# Patient Record
Sex: Male | Born: 1950 | Race: White | Hispanic: No | Marital: Married | Smoking: Former smoker
Health system: Southern US, Community
[De-identification: ages and names within clinical notes are randomized; demographics above are authoritative.]

## PROBLEM LIST (undated history)

## (undated) DIAGNOSIS — G6181 Chronic inflammatory demyelinating polyneuritis: Principal | ICD-10-CM

## (undated) DIAGNOSIS — I10 Essential (primary) hypertension: Secondary | ICD-10-CM

## (undated) DIAGNOSIS — G629 Polyneuropathy, unspecified: Secondary | ICD-10-CM

## (undated) DIAGNOSIS — E7800 Pure hypercholesterolemia, unspecified: Secondary | ICD-10-CM

## (undated) DIAGNOSIS — R269 Unspecified abnormalities of gait and mobility: Secondary | ICD-10-CM

## (undated) HISTORY — PX: ABDOMINAL SURGERY: SHX537

## (undated) HISTORY — DX: Chronic inflammatory demyelinating polyneuritis: G61.81

## (undated) HISTORY — DX: Polyneuropathy, unspecified: G62.9

## (undated) HISTORY — DX: Unspecified abnormalities of gait and mobility: R26.9

## (undated) HISTORY — DX: Pure hypercholesterolemia, unspecified: E78.00

## (undated) HISTORY — DX: Essential (primary) hypertension: I10

---

## 1971-10-23 HISTORY — PX: APPENDECTOMY: SHX54

## 1977-10-22 HISTORY — PX: FINGER AMPUTATION: SHX636

## 2001-11-11 ENCOUNTER — Ambulatory Visit (HOSPITAL_COMMUNITY): Admission: RE | Admit: 2001-11-11 | Discharge: 2001-11-11 | Payer: Self-pay | Admitting: Cardiology

## 2002-05-18 ENCOUNTER — Emergency Department (HOSPITAL_COMMUNITY): Admission: EM | Admit: 2002-05-18 | Discharge: 2002-05-18 | Payer: Self-pay | Admitting: Emergency Medicine

## 2002-05-18 ENCOUNTER — Encounter: Payer: Self-pay | Admitting: Emergency Medicine

## 2002-06-25 ENCOUNTER — Other Ambulatory Visit: Admission: RE | Admit: 2002-06-25 | Discharge: 2002-06-25 | Payer: Self-pay | Admitting: General Surgery

## 2005-08-23 ENCOUNTER — Emergency Department (HOSPITAL_COMMUNITY): Admission: EM | Admit: 2005-08-23 | Discharge: 2005-08-23 | Payer: Self-pay | Admitting: Emergency Medicine

## 2005-08-27 ENCOUNTER — Ambulatory Visit (HOSPITAL_COMMUNITY): Admission: RE | Admit: 2005-08-27 | Discharge: 2005-08-27 | Payer: Self-pay | Admitting: General Surgery

## 2005-10-22 HISTORY — PX: CHOLECYSTECTOMY: SHX55

## 2006-01-24 ENCOUNTER — Encounter (HOSPITAL_COMMUNITY): Admission: RE | Admit: 2006-01-24 | Discharge: 2006-02-23 | Payer: Self-pay | Admitting: General Surgery

## 2006-01-28 ENCOUNTER — Ambulatory Visit: Payer: Self-pay | Admitting: Internal Medicine

## 2006-01-29 ENCOUNTER — Ambulatory Visit: Payer: Self-pay | Admitting: Internal Medicine

## 2006-01-29 ENCOUNTER — Ambulatory Visit (HOSPITAL_COMMUNITY): Admission: RE | Admit: 2006-01-29 | Discharge: 2006-01-29 | Payer: Self-pay | Admitting: Internal Medicine

## 2006-02-05 ENCOUNTER — Ambulatory Visit (HOSPITAL_COMMUNITY): Admission: RE | Admit: 2006-02-05 | Discharge: 2006-02-05 | Payer: Self-pay | Admitting: Internal Medicine

## 2006-03-19 ENCOUNTER — Ambulatory Visit: Payer: Self-pay | Admitting: Internal Medicine

## 2006-04-16 ENCOUNTER — Ambulatory Visit: Payer: Self-pay | Admitting: Internal Medicine

## 2007-07-26 IMAGING — NM NM LIVER FUNCTION STUDY
1 series · 6 of 6 positions shown · non-contrast
Comparison: none

CLINICAL DATA: Right upper quadrant pain post cholecystectomy.  Evaluate for bile leak.
 NUCLEAR MEDICINE HEPATOBILIARY SCAN:
TECHNIQUE: Sequential abdominal images were obtained for approximately 60 minutes following intravenous injection of radiopharmaceutical.
 Radiopharmaceutical:  5 mCi Tc-XXm Choletec.

[Series 1: hepatobiliary · 3.20mm/px · 6 of 59 frames shown]
[frame 5/59]
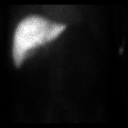
[frame 15/59]
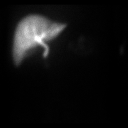
[frame 25/59]
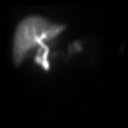
[frame 35/59]
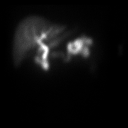
[frame 45/59]
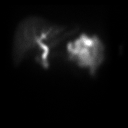
[frame 55/59]
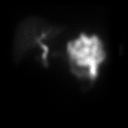

[6 of 6 positions shown; findings below may reference images not displayed]

FINDINGS: There is prompt visualization of the bile ducts and small bowel.  No suggestion of extravasation from the biliary tree.
IMPRESSION: Normal post cholecystectomy--specifically, no evidence for bile leak.

## 2008-02-16 ENCOUNTER — Ambulatory Visit: Payer: Self-pay | Admitting: Cardiology

## 2013-07-24 ENCOUNTER — Encounter: Payer: Self-pay | Admitting: Neurology

## 2013-07-24 ENCOUNTER — Telehealth: Payer: Self-pay | Admitting: *Deleted

## 2013-07-24 ENCOUNTER — Ambulatory Visit (INDEPENDENT_AMBULATORY_CARE_PROVIDER_SITE_OTHER): Payer: 59 | Admitting: Neurology

## 2013-07-24 VITALS — BP 146/100 | HR 84 | Temp 97.9°F | Ht 74.0 in | Wt 270.0 lb

## 2013-07-24 DIAGNOSIS — S0990XA Unspecified injury of head, initial encounter: Secondary | ICD-10-CM

## 2013-07-24 DIAGNOSIS — R2689 Other abnormalities of gait and mobility: Secondary | ICD-10-CM

## 2013-07-24 DIAGNOSIS — R279 Unspecified lack of coordination: Secondary | ICD-10-CM

## 2013-07-24 DIAGNOSIS — R278 Other lack of coordination: Secondary | ICD-10-CM

## 2013-07-24 DIAGNOSIS — G603 Idiopathic progressive neuropathy: Secondary | ICD-10-CM

## 2013-07-24 DIAGNOSIS — R29818 Other symptoms and signs involving the nervous system: Secondary | ICD-10-CM

## 2013-07-24 DIAGNOSIS — G609 Hereditary and idiopathic neuropathy, unspecified: Secondary | ICD-10-CM

## 2013-07-24 DIAGNOSIS — G589 Mononeuropathy, unspecified: Secondary | ICD-10-CM

## 2013-07-24 DIAGNOSIS — G629 Polyneuropathy, unspecified: Secondary | ICD-10-CM

## 2013-07-24 DIAGNOSIS — R6889 Other general symptoms and signs: Secondary | ICD-10-CM

## 2013-07-24 HISTORY — DX: Polyneuropathy, unspecified: G62.9

## 2013-07-24 NOTE — Telephone Encounter (Signed)
[facility] calling about pt having LP done.  He has not had previous CT or MRI brain done.  This is required before having LP done.  Verified with pt he has not had done.

## 2013-07-24 NOTE — Progress Notes (Signed)
Subjective:    Patient ID: Norman [name] is a 62 y.o. male.  HPI  Huston Foley, MD, PhD [facility] [address] [address] [place], [place] [address]  Dear Dr. Leandrew Koyanagi,   I saw your patient, Norman [name], upon your kind request in my neurologic clinic today for initial consultation of his neuropathy. The patient is unaccompanied today. As you know, Norman [name] is a very pleasant 62 year old right-handed gentleman with an underlying medical history of hypertension, hyperlipidemia, history of kidney stones, history of skin cancers, vitamin D deficiency, who has had progressive numbness in his feet and his fingers for the past several months. Symptoms started in the soles of his feet with tingling and progressed to numbness, no pain. He has involvement of his legs up to the knees and his hands. He has tingling in his hands and he has tingling and numbness in the LEs. He has noted fatigue, but no clear weakness and feels off balance. He has not fallen, but bumped his head to the car door while getting in the car. Sx seem to be ascending. He had blood work through your office on 07/15/13, which I reviewed: He had normal ESR, ANA comprehensive profile, B12, Lyme titer, Mg. He has a FHx of heart disease, stroke, breast cancer, no neuropathies, no leukemias. He is not diabetic. He quit smoking over 5 years ago and drinks alcohol on weekends only. 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 He has no Hx of prodromal illness, no exposure to toxins, no F/C/N/D.  Gabapentin was started at 300 mg tid, and helps with the paresthesias, not the numbness. He has no burning pain.   His Past Medical History Is Significant For: Past Medical History  Diagnosis Date  . Hypercholesteremia   . HTN (hypertension)     His Past Surgical History Is Significant For: Past Surgical History  Procedure Laterality Date  . Cholecystectomy  2007  . Finger amputation Right 1979   tips  . Appendectomy  1973  . Abdominal surgery      Skin growth removed    His Family History Is Significant For: Family History  Problem Relation Age of Onset  . Cancer Father   . Stroke Mother     His Social History Is Significant For: History   Social History  . Marital Status: Married    Spouse Name: N/A    Number of Children: N/A  . Years of Education: N/A   Social History Main Topics  . Smoking status: Former Smoker    Types: Cigarettes    Quit date: 07/24/2008  . Smokeless tobacco: None  . Alcohol Use: 2.0 oz/week    4 drink(s) per week  . Drug Use: None  . Sexual Activity: None   Other Topics Concern  . None   Social History Narrative  . None    His Allergies Are:  Allergies  Allergen Reactions  . Penicillins   . Prozac [Fluoxetine Hcl]   :   His Current Medications Are:  Outpatient Encounter Prescriptions as of 07/24/2013  Medication Sig Dispense Refill  . amLODipine (NORVASC) 10 MG tablet Take 1 tablet by mouth daily.      Marland Kitchen atorvastatin (LIPITOR) 20 MG tablet Take 1 tablet by mouth daily.      Marland Kitchen gabapentin (NEURONTIN) 300 MG capsule Take 1 capsule by mouth 3 (three) times daily.      Marland Kitchen losartan (COZAAR) 100 MG tablet Take 1 tablet by mouth daily.  No facility-administered encounter medications on file as of 07/24/2013.   Review of Systems:  Out of a complete 14 point review of systems, all are reviewed and negative with the exception of these symptoms as listed below:  Review of Systems  Constitutional: Positive for fatigue.  Respiratory: Positive for shortness of breath.   Musculoskeletal:       Cramps  Neurological: Positive for dizziness and numbness.       Restless leg    Objective:  Neurologic Exam  Physical Exam Physical Examination:   Filed Vitals:   07/24/13 0828  BP: 146/100  Pulse: 84  Temp: 97.9 F (36.6 C)    General Examination: The patient is a very pleasant 62 y.o. male in no acute distress. He appears  well-developed and well-nourished and adequately groomed. He is obese.  HEENT: Normocephalic, atraumatic, pupils are equal, round and reactive to light and accommodation. Funduscopic exam is normal with sharp disc margins noted. Extraocular tracking is good without limitation to gaze excursion or nystagmus noted. Normal smooth pursuit is noted. Hearing is grossly intact. Tympanic membranes are clear bilaterally. Face is symmetric with normal facial animation and normal facial sensation. Speech is clear with no dysarthria noted. There is no hypophonia. There is no lip, neck/head, jaw or voice tremor. Neck is supple with full range of passive and active motion. There are no carotid bruits on auscultation. Oropharynx exam reveals: mild mouth dryness, adequate dental hygiene and moderate airway crowding. Mallampati is class II. Tongue protrudes centrally and palate elevates symmetrically.   Chest: Clear to auscultation without wheezing, rhonchi or crackles noted.  Heart: S1+S2+0, regular and normal without murmurs, rubs or gallops noted.   Abdomen: Soft, non-tender and non-distended with normal bowel sounds appreciated on auscultation.  Extremities: There is trace pitting edema in the distal lower extremities bilaterally. Pedal pulses are intact.  Skin: Warm and dry without trophic changes noted. There are no varicose veins.  Musculoskeletal: exam reveals no obvious joint deformities, tenderness or joint swelling or erythema.   Neurologically:  Mental status: The patient is awake, alert and oriented in all 4 spheres. His memory, attention, language and knowledge are appropriate. There is no aphasia, agnosia, apraxia or anomia. Speech is clear with normal prosody and enunciation. Thought process is linear. Mood is congruent and affect is normal.  Cranial nerves are as described above under HEENT exam. In addition, shoulder shrug is normal with equal shoulder height noted. Motor exam: Normal bulk,  strength and tone is noted. There is no drift, tremor or rebound. Romberg is positive. Reflexes are 1+ in the upper extremities and absent in the lower extremities. Toes are equivocal bilaterally. Fine motor skills are intact with normal finger taps, normal hand movements, normal rapid alternating patting, normal foot taps and normal foot agility.  Cerebellar testing shows no dysmetria or intention tremor on finger to nose testing. Heel to shin is unremarkable bilaterally. There is no truncal ataxia.  Sensory exam is intact to light touch, pinprick, vibration, temperature sense and proprioception in the upper extremities, with perhaps mild decrease in pinprick sensation in the fingers. In the lower extremities he has significant decrease in light touch, pinprick, vibration, and temperature sense up to mid calf bilaterally. In addition he has absence of proprioception in his toes bilaterally.  Gait, station and balance: He stands up with mild difficulty and stands wide-based. He has significant ataxia while walking. He turns with mild insecurity. Posture is age-appropriate. He has preserved arm swing bilaterally. Assessment and  Plan:   In summary, Norman [name] is a very pleasant 62 y.o.-year old male with a history of progressive neuropathy in the past year. He has significant decrease in sensation to all modalities in both lower extremities, with absent reflexes in both lower extremities. The etiology of his neuropathy is thus far unclear. He is not diabetic, he has no family history, and he has no prior exposure to toxins. He does not drink alcohol excessively. I would like to pursue further workup with additional blood work, EMG and nerve conduction testing, heavy metal testing, and spinal fluid testing for a concern for CIDP. I may ask one of my neuromuscular colleagues to help out with further diagnostic testing and management in the near future. I had a long chat with the patient about his symptoms, my  findings, and his neuropathy. I do believe that this is primarily a peripheral process. I do not think he has any history or physical findings concerning for radiculopathy or myopathy or a central nervous system problem. At this juncture, I will do further testing and we will take it from there. I will see him in followup fairly soon and we should be able to call him with his test results as we get results back. He was in agreement. As far as medication is concerned he is on gabapentin 300 mg 3 times a day with improvement of his paresthesias and since he does not have much in the way of pain there is no reason to change or increase his medication at this time. I've encouraged him to call with any additional questions, concerns, problems or updates that may arise in the interim.  Thank you very much for allowing me to participate in the care of this nice patient. If I can be of any further assistance to you please do not hesitate to call me at [phone].  Sincerely,   Huston Foley, MD, PhD

## 2013-07-24 NOTE — Patient Instructions (Signed)
I think overall you are doing fairly well but I do want to suggest a few things today:  Remember to drink plenty of fluid, eat healthy meals and do not skip any meals. Try to eat protein with a every meal and eat a healthy snack such as fruit or nuts in between meals. Try to keep a regular sleep-wake schedule and try to exercise daily, particularly in the form of walking, 20-30 minutes a day, if you can.   As far as your medications are concerned, I would like to suggest no new medications.    As far as diagnostic testing: EMG/NCV, blood work, spinal fluid, urine for heavy metals.  I would like to see you back in 3 months, sooner if we need to. Please call us with any interim questions, concerns, problems, updates or refill requests.  Please also call us for any test results so we can go over those with you on the phone. Brett Canales is my clinical assistant and will answer any of your questions and relay your messages to me and also relay most of my messages to you.  Our phone number is [phone]. We also have an after hours call service for urgent matters and there is a physician on-call for urgent questions. For any emergencies you know to call 911 or go to the nearest emergency room.

## 2013-07-24 NOTE — Telephone Encounter (Signed)
I LMVM for pt re: CT head ordered prior to LP.  Needs authorization and then will proceed with LP once results obtained.  Thanks

## 2013-07-24 NOTE — Telephone Encounter (Signed)
Entered CT head request. Pls advise pt, that we will go ahead and do CT head as it is required before LP, plus: he had reported that bump to his head, when he hit his head as he was getting into his car the other day.

## 2013-07-28 ENCOUNTER — Encounter (INDEPENDENT_AMBULATORY_CARE_PROVIDER_SITE_OTHER): Payer: Self-pay | Admitting: Radiology

## 2013-07-28 ENCOUNTER — Ambulatory Visit (INDEPENDENT_AMBULATORY_CARE_PROVIDER_SITE_OTHER): Payer: 59 | Admitting: Neurology

## 2013-07-28 DIAGNOSIS — G5602 Carpal tunnel syndrome, left upper limb: Secondary | ICD-10-CM

## 2013-07-28 DIAGNOSIS — G609 Hereditary and idiopathic neuropathy, unspecified: Secondary | ICD-10-CM

## 2013-07-28 DIAGNOSIS — Z0289 Encounter for other administrative examinations: Secondary | ICD-10-CM

## 2013-07-28 DIAGNOSIS — R2689 Other abnormalities of gait and mobility: Secondary | ICD-10-CM

## 2013-07-28 DIAGNOSIS — G5601 Carpal tunnel syndrome, right upper limb: Secondary | ICD-10-CM

## 2013-07-28 DIAGNOSIS — G6181 Chronic inflammatory demyelinating polyneuritis: Secondary | ICD-10-CM

## 2013-07-28 DIAGNOSIS — G603 Idiopathic progressive neuropathy: Secondary | ICD-10-CM

## 2013-07-28 LAB — IFE AND PE, SERUM
Albumin/Glob SerPl: 1.3 (ref 0.7–2.0)
IgA/Immunoglobulin A, Serum: 391 mg/dL (ref 91–414)
IgM (Immunoglobulin M), Srm: 52 mg/dL (ref 40–230)
Total Protein: 6.3 g/dL (ref 6.0–8.5)

## 2013-07-28 LAB — C-REACTIVE PROTEIN: CRP: 10.3 mg/L — ABNORMAL HIGH (ref 0.0–4.9)

## 2013-07-28 LAB — AMMONIA: Ammonia: 104 ug/dL — ABNORMAL HIGH (ref 27–102)

## 2013-07-28 LAB — HEAVY METALS PROFILE II, BLOOD
Arsenic: 5 ug/L (ref 2–23)
Cadmium: 0.7 ug/L (ref 0.0–1.2)
Mercury: 2.6 ug/L (ref 0.0–14.9)

## 2013-07-28 LAB — RHEUMATOID FACTOR: Rhuematoid fact SerPl-aCnc: <7 IU/mL (ref 0.0–13.9)

## 2013-07-28 LAB — HTLV-I/II ANTIBODIES, QUAL.: HTLV I/II Ab: NEGATIVE

## 2013-07-28 NOTE — Procedures (Signed)
  HISTORY:  Norman [name] is a 62 year old gentleman with a six-month history of progressive numbness involving the lower extremities and now the hands. The patient has had some gait instability. The patient is being evaluated for a possible peripheral neuropathy. There is no history of diabetes.  NERVE CONDUCTION STUDIES:   Nerve conduction studies were performed on both upper extremities. The distal motor latencies for the median nerves were prolonged bilaterally, normal for the ulnar nerves bilaterally. The motor amplitudes for the median and ulnar nerves were normal bilaterally. Slowing was seen for the median nerves bilaterally, normal for the ulnar nerves bilaterally. The F wave latencies for the median and ulnar nerves were prolonged bilaterally. The sensory latencies for the median nerves were borderline normal on the right, absent on the left, and prolonged for the ulnar nerves bilaterally, normal for the radial nerves bilaterally.  Nerve conduction studies were performed on both lower extremities. No response was seen for the right peroneal nerve, with prolongation of the distal motor latency for the left peroneal nerve, with a low motor amplitude. The distal motor latencies for the posterior tibial nerves were normal bilaterally, with a low motor amplitude on the right, normal on the left. Slowing was seen for the left peroneal nerve and for the posterior tibial nerves bilaterally. Significant prolongation of the F wave latencies for the left peroneal nerve and for the posterior tibial nerves bilaterally were seen. No response was seen for the right peroneal nerve F wave latency. The peroneal sensory latencies were unobtainable bilaterally.  EMG STUDIES:  EMG study was performed on the left lower extremity:  The tibialis anterior muscle reveals 2 to 4K motor units with full recruitment. No fibrillations or positive waves were seen. The peroneus tertius muscle reveals 2 to 5K motor units  with slightly decreased recruitment. No fibrillations or positive waves were seen. The medial gastrocnemius muscle reveals 1 to 3K motor units with full recruitment. No fibrillations or positive waves were seen. The vastus lateralis muscle reveals 2 to 4K motor units with full recruitment. No fibrillations or positive waves were seen. The iliopsoas muscle reveals 2 to 4K motor units with full recruitment. No fibrillations or positive waves were seen. The biceps femoris muscle (long head) reveals 2 to 4K motor units with full recruitment. No fibrillations or positive waves were seen. The lumbosacral paraspinal muscles were tested at 3 levels, and revealed no abnormalities of insertional activity at all 3 levels tested. There was good relaxation.   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 Bilateral carpal tunnel syndrome is seen of mild severity. EMG evaluation of the left lower extremity is almost normal, once again supporting the diagnosis of a primarily demyelinating peripheral neuropathy. There is no evidence of an overlying lumbosacral radiculopathy on the left. 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  Marlan Palau MD 07/28/2013 10:57 AM  [facility] [address] [place], [place] [address]  Phone [phone] Fax [phone]

## 2013-07-29 ENCOUNTER — Telehealth: Payer: Self-pay | Admitting: Neurology

## 2013-07-29 DIAGNOSIS — G6181 Chronic inflammatory demyelinating polyneuritis: Secondary | ICD-10-CM

## 2013-07-29 NOTE — Telephone Encounter (Signed)
I called and let patient know about his test results. I explained to him that his diabetes marker was borderline elevated at 5.8 and this underlies the importance of weight control and eating right. His vitamin D level was borderline low but he has recently started taking over-the-counter vitamin D. His CRP was elevated at twice the normal which indicates that there may be an inflammatory process going on. It could be in keeping with his demyelinating neuropathy. His ammonia level was slightly elevated borderline at 104 and I asked him to just be mindful of taking medication. This may just indicate that he is on chronic medications. It may be something that needs to be rechecked down the Road, this is certainly not in the toxic range. He demonstrated understanding. 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  I suggested that we go ahead and request for Dr. Anne Hahn to help him with further diagnostics and management. I had asked Dr. Anne Hahn verbally yesterday they would be willing to take care of this patient and he kindly agreed.  I had ordered a head CT and a fluoroscopic guided lumbar puncture but none of these have been scheduled yet. I will follow through on this because it is important that we get these test results. I told him I will follow through today on his head CT and lumbar puncture appointments. He demonstrated understanding and agreement with the plan. I will put in a referral for Dr. Anne Hahn.  Alverda Skeans: can you follow up on CT head and LP appts?   Sandy Poteat: can you help arrange new pt appt w Dr. Anne Hahn on this patient?

## 2013-07-29 NOTE — Progress Notes (Signed)
Quick Note:  I called and let patient know about his test results. I explained to him that his diabetes marker was borderline elevated at 5.8 and this underlies the importance of weight control and eating right. His vitamin D level was borderline low but he has recently started taking over-the-counter vitamin D. His CRP was elevated at twice the normal which indicates that there may be an inflammatory process going on. It could be in keeping with his demyelinating neuropathy. His ammonia level was slightly elevated borderline at 104 and I asked him to just be mindful of taking medication. This may just indicate that he is on chronic medications. It may be something that needs to be rechecked down the Road, this is certainly not in the toxic range. He demonstrated understanding. 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 I suggested that we go ahead and request for Dr. Anne Hahn to help him with further diagnostics and management. I had ordered a head CT and a fluoroscopic guided lumbar puncture but none of these have been scheduled yet. I will follow through on this because it is important that we get these test results. I told him I will follow through today on his head CT and lumbar puncture appointments. He demonstrated understanding and agreement with the plan. I will put in a referral for Dr. Anne Hahn. ______

## 2013-08-03 ENCOUNTER — Telehealth: Payer: Self-pay | Admitting: Neurology

## 2013-08-03 ENCOUNTER — Other Ambulatory Visit: Payer: Self-pay | Admitting: Neurology

## 2013-08-03 MED ORDER — PREDNISONE 20 MG PO TABS: 40.0000 mg | ORAL_TABLET | Freq: Every day | ORAL | Status: DC

## 2013-08-03 NOTE — Telephone Encounter (Signed)
I called patient. The patient has been set up for a CT scan of the brain, and a spinal tap. Once this has been done, he is to start prednisone. I will need a revisit sometime within the next 3 months.

## 2013-08-04 NOTE — Telephone Encounter (Signed)
I gave the request for LP and CT to [facility] in referrals.  Diane as NP coordinator, the referral for NP app with Dr. Anne Hahn.

## 2013-08-12 LAB — HEAVY METALS PROFILE II, URINE
Cadmium, Ur: NOT DETECTED ug/L
Mercury, Ur: NOT DETECTED ug/L (ref 0–19)

## 2013-08-13 NOTE — Progress Notes (Signed)
Quick Note:  Please advise patient that there were no significant levels of any heavy metals detected in his urine. ______

## 2013-08-13 NOTE — Progress Notes (Signed)
Quick Note:  Shared Dr Teofilo Pod findings as noted, confirmed w/ patient ______

## 2013-08-17 DIAGNOSIS — R209 Unspecified disturbances of skin sensation: Secondary | ICD-10-CM

## 2013-08-18 ENCOUNTER — Ambulatory Visit
Admission: RE | Admit: 2013-08-18 | Discharge: 2013-08-18 | Disposition: A | Discharge status: Home / Self Care | Payer: 59 | Source: Ambulatory Visit | Attending: Neurology | Admitting: Neurology

## 2013-08-18 VITALS — BP 116/80 | HR 80

## 2013-08-18 DIAGNOSIS — G629 Polyneuropathy, unspecified: Secondary | ICD-10-CM

## 2013-08-18 DIAGNOSIS — G603 Idiopathic progressive neuropathy: Secondary | ICD-10-CM

## 2013-08-18 DIAGNOSIS — R2689 Other abnormalities of gait and mobility: Secondary | ICD-10-CM

## 2013-08-18 DIAGNOSIS — G609 Hereditary and idiopathic neuropathy, unspecified: Secondary | ICD-10-CM

## 2013-08-18 DIAGNOSIS — S0990XA Unspecified injury of head, initial encounter: Secondary | ICD-10-CM

## 2013-08-18 LAB — CSF CELL COUNT WITH DIFFERENTIAL: Tube #: 2

## 2013-08-18 LAB — GLUCOSE, CSF: Glucose, CSF: 66 mg/dL (ref 43–76)

## 2013-08-18 NOTE — Progress Notes (Signed)
Pt returned from LP. Pt is in good spirits and son is at bedside.

## 2013-08-18 NOTE — Progress Notes (Signed)
Blood drawn to go with spinal fluid for study. Blood drawn from right [facility], site is unremarkable and pt tolerated well. Discharge instructions explained to pt.

## 2013-08-19 ENCOUNTER — Telehealth: Payer: Self-pay | Admitting: Neurology

## 2013-08-19 NOTE — Telephone Encounter (Signed)
I called patient. 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 The patient believes that he is continued to progress even since the nerve conduction study with numbness into the thigh level. The patient will go up on the prednisone taking 60 mg daily. I'll see him back in 6 weeks. If progression continues, we will get him started on IVIG. The patient is on vitamin D, taking 2000 international units daily.

## 2013-08-20 LAB — ANGIOTENSIN CONVERTING ENZYME, CSF: ACE, CSF: 9 U/L (ref <=15)

## 2013-08-20 NOTE — Progress Notes (Signed)
Quick Note:  Dr. Anne Hahn spoke to pt 08-19-13 at 1808. ______

## 2013-08-20 NOTE — Progress Notes (Signed)
Quick Note:  Please advise patient that his head CT was fine. I was trying to get in touch with him yesterday regarding his spinal fluid test results. Please ask them to provide you with a alternative number as there is only one number listed at all it said was a number not his name so it is not possible for [place] to leave any detailed messages. 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 I have discussed this with Dr. Anne Hahn who will be in touch with the patient regarding possibly initiating treatment. Huston Foley, MD, PhD [facility]  ______

## 2013-08-22 LAB — CSF CULTURE W GRAM STAIN: Organism ID, Bacteria: NO GROWTH

## 2013-08-24 LAB — VIRAL CULTURE VIRC

## 2013-08-24 LAB — MYELIN BASIC PROTEIN, CSF: Myelin Basic Protein: <2 mcg/L (ref 0.0–4.0)

## 2013-08-25 ENCOUNTER — Encounter: Payer: Self-pay | Admitting: Neurology

## 2013-08-26 ENCOUNTER — Telehealth: Payer: Self-pay | Admitting: Neurology

## 2013-08-26 ENCOUNTER — Encounter: Payer: Self-pay | Admitting: Neurology

## 2013-08-26 NOTE — Telephone Encounter (Signed)
Patient calling to speak with Dr Frances Furbish about LP,returning her call

## 2013-08-26 NOTE — Telephone Encounter (Signed)
I was able to talk at length with the patient over the phone. I reiterated the lumbar puncture test results which are in keeping with the diagnosis of CIDP. Per Dr. Anne Hahn he has started on prednisone 60 mg daily and is tolerating it well. He feels that symptoms have stabilized and thankfully are not progressing at this point. He has minimal to no side effects with the prednisone with the exception of increase in appetite and mild insomnia. I advised him that he can take an over-the-counter sleep aid such as Unisom or storebrand sleep aid to help his symptoms. I did ask him to be mindful of weight gain, some of which can be in the form of fluid retention. He understood and was in agreement. He is going to continue with the prednisone as prescribed and will see Dr. Anne Hahn on 09/28/2013. He will also probably talked to Dr. Anne Hahn about the use of IVIG. The patient did not have any additional questions. I talked to him about the rest of his CSF results as well as his head CT result which was unremarkable.

## 2013-08-27 ENCOUNTER — Other Ambulatory Visit: Payer: Self-pay

## 2013-09-15 LAB — FUNGUS CULTURE W SMEAR: Smear Result: NONE SEEN

## 2013-09-19 ENCOUNTER — Other Ambulatory Visit: Payer: Self-pay

## 2013-09-19 MED ORDER — PREDNISONE 20 MG PO TABS: 60.0000 mg | ORAL_TABLET | Freq: Every day | ORAL | Status: DC

## 2013-09-28 ENCOUNTER — Ambulatory Visit (INDEPENDENT_AMBULATORY_CARE_PROVIDER_SITE_OTHER): Payer: 59 | Admitting: Neurology

## 2013-09-28 ENCOUNTER — Encounter: Payer: Self-pay | Admitting: Neurology

## 2013-09-28 VITALS — BP 144/92 | HR 66 | Wt 269.0 lb

## 2013-09-28 DIAGNOSIS — R269 Unspecified abnormalities of gait and mobility: Secondary | ICD-10-CM | POA: Insufficient documentation

## 2013-09-28 DIAGNOSIS — G6181 Chronic inflammatory demyelinating polyneuritis: Secondary | ICD-10-CM

## 2013-09-28 HISTORY — DX: Unspecified abnormalities of gait and mobility: R26.9

## 2013-09-28 HISTORY — DX: Chronic inflammatory demyelinating polyneuritis: G61.81

## 2013-09-28 NOTE — Progress Notes (Signed)
Reason for visit: CIDP  Norman [name] is an 62 y.o. male  History of present illness:  Norman [name] is a 62 year old right-handed white male with a history of progressive weakness of the extremities, numbness of the arms and legs. The patient was found to have features typical for CIDP on nerve conduction study. The patient was placed on prednisone taking 60 mg daily with some benefit. The patient has had a lot of swelling the legs on this medication, but some of the numbness affecting the thigh area has improved. The patient still has significant balance issues, and he is falling on a regular basis, 2 or 3 times a week. The patient is using a cane for ambulation, but he may have to get a walker. The patient is having problems with balance in the shower, and he will be getting a shower chair. The patient denies any significant discomfort in the legs or feet. The patient returns to this office for further evaluation.  Past Medical History  Diagnosis Date  . Hypercholesteremia   . HTN (hypertension)   . Neuropathy 07/24/2013  . 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 Abnormality of gait 09/28/2013    Past Surgical History  Procedure Laterality Date  . Cholecystectomy  2007  . 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 Appendectomy  1973  . Abdominal surgery      Skin growth removed    Family History  Problem Relation Age of Onset  . Cancer Father   . Stroke Mother     Social history:  reports that he quit smoking about 5 years ago. His smoking use included Cigarettes. He smoked 0.00 packs per day. He has never used smokeless tobacco. He reports that he drinks about 2.0 ounces of alcohol per week. He reports that he does not use illicit drugs.    Allergies  Allergen Reactions  . Penicillins     infant  . Prozac [Fluoxetine Hcl] Other (See Comments)    Made pt jittery    Medications:  Current Outpatient Prescriptions on File Prior to Visit    Medication Sig Dispense Refill  . amLODipine (NORVASC) 10 MG tablet Take 1 tablet by mouth daily.      Marland Kitchen atorvastatin (LIPITOR) 20 MG tablet Take 1 tablet by mouth daily.      . cholecalciferol (VITAMIN D) 1000 UNITS tablet Take 1,000 Units by mouth 2 (two) times daily.      Marland Kitchen gabapentin (NEURONTIN) 300 MG capsule Take 1 capsule by mouth 3 (three) times daily.      Marland Kitchen losartan (COZAAR) 100 MG tablet Take 1 tablet by mouth daily.      . predniSONE (DELTASONE) 20 MG tablet Take 3 tablets (60 mg total) by mouth daily.  270 tablet  0   No current facility-administered medications on file prior to visit.    ROS:  Out of a complete 14 system review of symptoms, the patient complains only of the following symptoms, and all other reviewed systems are negative.  Fatigue Leg swelling Restless legs, frequent walking Frequency of urination Muscle cramps, coordination problems, gait problems Numbness, weakness  Blood pressure 144/92, pulse 66, weight 269 lb (122.018 kg).  Physical Exam  General: The patient is alert and cooperative at the time of the examination. The patient is moderately obese.  Skin: 3+ edema below the knees is seen bilaterally.   Neurologic Exam  Mental status: The patient is oriented x 3.  Cranial nerves:  Facial symmetry is present. Speech is normal, no aphasia or dysarthria is noted. Extraocular movements are full. Visual fields are full.  Motor: The patient has good strength in all 4 extremities, with exception of some weakness of the intrinsic muscles of the hands bilaterally, and mild bilateral foot drops.  Sensory examination: The patient has significantly impaired pinprick sensation up to the knees bilaterally.  Coordination: The patient has good finger-nose-finger and heel-to-shin bilaterally.  Gait and station: The patient has a wide-based, unsteady gait. The patient walks with a cane. Tandem gait was not attempted. Romberg is negative, but is unsteady. No  drift is seen.  Reflexes: Deep tendon reflexes are symmetric, but are absent throughout.   Assessment/Plan:  1. CIDP  2. Gait disturbance  The patient may have stabilized somewhat prednisone, but he is having significant walking problems, falls. The patient will be placed on IVIG to help elucidate benefit. The patient will go from 60 mg daily to 40 mg daily of prednisone when he starts the IVIG. The patient will be followed up in about 2 months.  Marlan Palau MD 09/28/2013 12:04 PM  [facility] [address] [place], [place] [address]  Phone [phone] Fax [phone]

## 2013-09-28 NOTE — Patient Instructions (Signed)

## 2013-10-26 ENCOUNTER — Telehealth: Payer: Self-pay | Admitting: Neurology

## 2013-10-26 ENCOUNTER — Ambulatory Visit: Payer: Self-pay | Admitting: Neurology

## 2013-10-26 NOTE — Telephone Encounter (Signed)
[facility] and she will start the process for the Home IVIG.  I also spoke with the patient and told him [facility] will be in touch with him in the next 2 days.

## 2013-10-29 ENCOUNTER — Telehealth: Payer: Self-pay | Admitting: Neurology

## 2013-10-29 NOTE — Telephone Encounter (Signed)
NEEDS DIRECTIONS ABOUT DOSAGE FOR IVIG EVERY 3 WEEKS

## 2013-10-30 NOTE — Telephone Encounter (Signed)
I called and [facility] to [name] at [facility] and relayed the directions for the IVIG, (calrifying the order).  She verbalized understanding.

## 2013-11-05 ENCOUNTER — Other Ambulatory Visit: Payer: Self-pay | Admitting: Neurology

## 2013-11-25 ENCOUNTER — Ambulatory Visit: Payer: 59 | Admitting: Neurology

## 2013-12-10 ENCOUNTER — Encounter: Payer: Self-pay | Admitting: Neurology

## 2013-12-10 ENCOUNTER — Ambulatory Visit (INDEPENDENT_AMBULATORY_CARE_PROVIDER_SITE_OTHER): Payer: 59 | Admitting: Neurology

## 2013-12-10 VITALS — BP 136/94 | HR 91 | Ht 74.75 in | Wt 274.0 lb

## 2013-12-10 DIAGNOSIS — R269 Unspecified abnormalities of gait and mobility: Secondary | ICD-10-CM

## 2013-12-10 DIAGNOSIS — G6181 Chronic inflammatory demyelinating polyneuritis: Secondary | ICD-10-CM

## 2013-12-10 MED ORDER — PREDNISONE 20 MG PO TABS: 30.0000 mg | ORAL_TABLET | Freq: Every day | ORAL | Status: DC

## 2013-12-10 NOTE — Progress Notes (Signed)
Reason for visit: CIDP  Norman [name] is an 63 y.o. male  History of present illness:  Norman [name] is a 63 year old right-handed white male with a history of a peripheral neuropathy consistent with CIDP. The patient has undergone lumbar puncture that revealed evidence of elevated protein levels, no inflammatory cells. The patient has initiated a five-day course of IVIG 3 weeks ago, and he will be getting his next dose in one week. The patient will be getting 2 doses of IVIG every 4 weeks. The patient is on prednisone, and he has dropped from 60 mg daily to 40 mg daily. The patient felt poorly for 1-1/2 days with the drop in the prednisone dose. The patient has had an occasional fall. The patient uses a cane for ambulation. The patient reports some numbness up to the distal thigh level in the legs. The patient has prominent peripheral edema, and this has improved slightly with the lower prednisone dose. The patient returns for an evaluation. The patient has not noted any change in his strength.  Past Medical History  Diagnosis Date  . Hypercholesteremia   . HTN (hypertension)   . Neuropathy 07/24/2013  . 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 Abnormality of gait 09/28/2013    Past Surgical History  Procedure Laterality Date  . Cholecystectomy  2007  . 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 Appendectomy  1973  . Abdominal surgery      Skin growth removed    Family History  Problem Relation Age of Onset  . Cancer Father   . Stroke Mother     Social history:  reports that he quit smoking about 5 years ago. His smoking use included Cigarettes. He smoked 0.00 packs per day. He has never used smokeless tobacco. He reports that he drinks about 2.0 ounces of alcohol per week. He reports that he does not use illicit drugs.    Allergies  Allergen Reactions  . Penicillins     infant  . Prozac [Fluoxetine Hcl] Other (See Comments)    Made pt jittery      Medications:  Current Outpatient Prescriptions on File Prior to Visit  Medication Sig Dispense Refill  . amLODipine (NORVASC) 10 MG tablet Take 1 tablet by mouth daily.      Marland Kitchen atorvastatin (LIPITOR) 20 MG tablet Take 1 tablet by mouth daily.      . cholecalciferol (VITAMIN D) 1000 UNITS tablet Take 1,000 Units by mouth 2 (two) times daily.      Marland Kitchen gabapentin (NEURONTIN) 300 MG capsule Take 1 capsule by mouth 3 (three) times daily.      Marland Kitchen losartan (COZAAR) 100 MG tablet Take 1 tablet by mouth daily.       No current facility-administered medications on file prior to visit.    ROS:  Out of a complete 14 system review of symptoms, the patient complains only of the following symptoms, and all other reviewed systems are negative.  Fatigue Restless legs, insomnia, daytime drowsiness Walking difficulties Dizziness, numbness, weakness  Blood pressure 136/94, pulse 91, height 6' 2.75" (1.899 m), weight 274 lb (124.286 kg).  Physical Exam  General: The patient is alert and cooperative at the time of the examination. The patient is markedly obese.  Skin: 3+ edema below the knees is noted bilaterally.   Neurologic Exam  Mental status: The patient is oriented x 3.  Cranial nerves: Facial symmetry is present. Speech is normal, no aphasia or dysarthria is  noted. Extraocular movements are full. Visual fields are full.  Motor: The patient has good strength in all 4 extremities. There is no evidence of a footdrop on either side.  Sensory examination: Soft touch sensation is depressed in both feet and legs, slightly more common on the left than the right. Soft touch sensation is symmetric on the hands and face.  Coordination: The patient has good finger-nose-finger and heel-to-shin bilaterally.  Gait and station: The patient has a slightly wide-based gait, the patient uses a cane for ambulation. Tandem gait was not attempted. Romberg is positive. No drift is seen.  Reflexes: Deep tendon  reflexes are symmetric, but reflexes are absent throughout.   Assessment/Plan:  1. CIDP  The patient will continue his IVIG treatments, getting two infusions once a month. The patient will reduce the prednisone dose to 30 mg daily next week. The patient will followup through this office in about 3 months. Sometime within the next 6 months, a nerve conduction study will be repeated.  Marlan Palau. Keith Willis MD 12/10/2013 8:56 PM  [facility] [address] [place], [place] [address]  Phone [phone] Fax [phone]

## 2013-12-10 NOTE — Patient Instructions (Signed)

## 2014-01-26 ENCOUNTER — Encounter: Payer: Self-pay | Admitting: Neurology

## 2014-03-10 ENCOUNTER — Ambulatory Visit (INDEPENDENT_AMBULATORY_CARE_PROVIDER_SITE_OTHER): Payer: 59 | Admitting: Adult Health

## 2014-03-10 ENCOUNTER — Encounter: Payer: Self-pay | Admitting: Adult Health

## 2014-03-10 ENCOUNTER — Ambulatory Visit: Payer: Self-pay | Admitting: Neurology

## 2014-03-10 VITALS — BP 145/91 | HR 60 | Temp 97.4°F | Ht 74.0 in | Wt 290.0 lb

## 2014-03-10 DIAGNOSIS — R269 Unspecified abnormalities of gait and mobility: Secondary | ICD-10-CM

## 2014-03-10 DIAGNOSIS — G6181 Chronic inflammatory demyelinating polyneuritis: Secondary | ICD-10-CM

## 2014-03-10 MED ORDER — PREDNISONE 10 MG PO TABS
10.0000 mg | ORAL_TABLET | Freq: Every day | ORAL | Status: DC
Start: 2014-03-10 — End: 2014-06-15

## 2014-03-10 MED ORDER — GABAPENTIN 300 MG PO CAPS: ORAL_CAPSULE | ORAL | Status: DC

## 2014-03-10 MED ORDER — PREDNISONE 5 MG PO TABS
ORAL_TABLET | ORAL | Status: DC
Start: 2014-03-10 — End: 2014-06-15

## 2014-03-10 NOTE — Progress Notes (Signed)
I have read the note, and I agree with the clinical assessment and plan.  Charles K Willis   

## 2014-03-10 NOTE — Progress Notes (Signed)
PATIENT: Norman [name] DOB: 11/04/1950  REASON FOR VISIT: follow up HISTORY FROM: patient  HISTORY OF PRESENT ILLNESS: Mr. Norman [name] is a 63 year old right-handed white male with a history of a peripheral neuropathy consistent with CIDP. He returns today for follow up. The patient is on 20 mg of prednisone and receives IVIG infusions monthly. Patient states that the numbness in the arms and legs have improved. He states that he now only has numbness in his fingertips whereas before it was in half of his hand. The numbness in his legs now only goes to mid calf whereas before it was going to his thigh. Patient states that his leg strength is a lot better than it was but feels that it is not back to his baseline. Dizziness has improved.  Patient had a couple of falls a few months ago but did not sustain any injuries. He uses a cane to help with ambulation. Denies any pain. Patient feels that overall he is doing better, only complains that he is fatigued and his lower legs are edematous. He is not sure if it is due to inactivity or medication or a combination of both. He states that since he decreased prednisone to 20 mg he has noticed that the swelling is only in his ankles and feet whereas before it was extending up to the calf. He states that the swelling causes some discomfort when walking. Can usually only walk for several minutes before his feet start to hurt. 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 At night he tries to prop his legs up. No new medical issues since the last visit  REVIEW OF SYSTEMS: Full 14 system review of systems performed and notable only for:  Constitutional: fatigue Eyes: N/A Ear/Nose/Throat: N/A  Skin: N/A  Cardiovascular: leg swelling Respiratory: N/A  Gastrointestinal: N/A  Genitourinary: frequency of urination  Hematology/Lymphatic: N/A  Endocrine: N/A Musculoskeletal: walking difficulty  Allergy/Immunology: N/A  Neurological: numbness  and weakness Psychiatric: N/A Sleep: frequent waking    ALLERGIES: Allergies  Allergen Reactions  . Penicillins     infant  . Prozac [Fluoxetine Hcl] Other (See Comments)    Made pt jittery    HOME MEDICATIONS: Outpatient Prescriptions Prior to Visit  Medication Sig Dispense Refill  . amLODipine (NORVASC) 10 MG tablet Take 1 tablet by mouth daily.      Marland Kitchen. atorvastatin (LIPITOR) 20 MG tablet Take 1 tablet by mouth daily.      . cholecalciferol (VITAMIN D) 1000 UNITS tablet Take 1,000 Units by mouth 2 (two) times daily.      Marland Kitchen. gabapentin (NEURONTIN) 300 MG capsule Take 1 capsule by mouth 3 (three) times daily.      . Immune Globulin, Human, (OCTAGAM IV) Inject into the vein.      Marland Kitchen. losartan (COZAAR) 100 MG tablet Take 1 tablet by mouth daily.      . predniSONE (DELTASONE) 20 MG tablet Take 1.5 tablets (30 mg total) by mouth daily with breakfast.       No facility-administered medications prior to visit.    PAST MEDICAL HISTORY: Past Medical History  Diagnosis Date  . Hypercholesteremia   . HTN (hypertension)   . Neuropathy 07/24/2013  . 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 Abnormality of gait 09/28/2013    PAST SURGICAL HISTORY: Past Surgical History  Procedure Laterality Date  . Cholecystectomy  2007  . 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  Appendectomy  1973  . Abdominal surgery      Skin growth removed    FAMILY HISTORY: Family History  Problem Relation Age of Onset  . Cancer Father   . Stroke Mother     SOCIAL HISTORY: History   Social History  . Marital Status: Married    Spouse Name: [name]    Number of Children: 5  . Years of Education: HS   Occupational History  .      [employer]   Social History Main Topics  . Smoking status: Former Smoker    Types: Cigarettes    Quit date: 07/24/2008  . Smokeless tobacco: Never Used  . Alcohol Use: 2.0 oz/week    4 drink(s) per week     Comment: minimal amount  .  Drug Use: No  . Sexual Activity: Not on file   Other Topics Concern  . Not on file   Social History Narrative   Patient is married Biomedical engineer) and lives at home with his son and daughter.   Patient has five children.   Patient is working full-time.   Patient has a high school education.   Patient is left-handed.   Patient drinks very little caffeine.      PHYSICAL EXAM  Filed Vitals:   03/10/14 1038  BP: 145/91  Pulse: 60  Temp: 97.4 F (36.3 C)  TempSrc: Oral  Height: 6\' 2"  (1.88 m)  Weight: 290 lb (131.543 kg)   Body mass index is 37.22 kg/(m^2).  Generalized: Well developed, in no acute distress  Musculoskeletal: No deformity. 2+ pitting edema in bilateral lower extremities  Neurological examination  Mentation: Alert oriented to time, place, history taking. Follows all commands speech and language fluent Cranial nerve II-XII:  Extraocular movements were full, visual field were full on confrontational test.  Motor: The motor testing reveals 5 over 5 strength of all 4 extremities. Good symmetric motor tone is noted throughout.  Sensory: Sensory testing is intact to soft touch  on all 4 extremities. No evidence of extinction is noted.  Coordination: Cerebellar testing reveals good finger-nose-finger and heel-to-shin bilaterally.  Gait and station: Gait is slightly wide based. Uses a cane to ambulate. Tandem gait is normal. Romberg is negative. No drift is seen.  Reflexes: Deep tendon reflexes are symmetric but depressed bilaterally  DIAGNOSTIC DATA (LABS, IMAGING, TESTING) - I reviewed patient records, labs, notes, testing and imaging myself where available.      Component Value Date/Time   PROT 6.3 07/24/2013 0950    Lab Results  Component Value Date   HGBA1C 5.8* 07/24/2013    Lab Results  Component Value Date   TSH 1.300 07/24/2013    ASSESSMENT AND PLAN 63 y.o. year old male  has a past medical history of Hypercholesteremia; HTN (hypertension); Neuropathy  (07/24/2013); CIDP (chronic inflammatory demyelinating polyneuropathy) (09/28/2013); and Abnormality of gait (09/28/2013). here with   1. CIDP (chronic inflammatory demyelinating polyneuropathy) 2. Abnormality of gait  Patient's numbness and weakness have improved since starting IVIG and prednisone. Patient is having significant swelling in the lower extremities that is limiting his ability to ambulate due to pain. Patient does elevate his legs at night and notices a modest decrease in the mornings. However swelling returns after being  upright for several minutes. We will decrease his prednisone to 17.5 mg for one month then 15 mg daily. We will also discontinue gabapentin, patient will decrease to 1 tablet BID for 1 week then 1 tablet daily for 1 week  then stop. Patient will continue IVIG--he states he has two months left for treatment. We will follow-up in 3 months at that time we will consider repeating nerve conduction studies.    Butch PennyMegan Renly Roots, MSN, NP-C 03/10/2014, 10:43 AM [facility] [address] [place], [place] [address] [phone]  Note: This document was prepared with digital dictation and possible smart phrase technology. Any transcriptional errors that result from this process are unintentional.

## 2014-03-10 NOTE — Patient Instructions (Signed)
Chronic Inflammatory Demyelinating Polyneuropathy Chronic inflammatory demyelinating polyneuropathy (CIDP) is a condition in which damage to nerves results in impaired nerve function. This may cause diminished or unusual sensations, abnormal muscle function, or both. CAUSES  CIDP is caused when the covering of nerves (myelin sheath) is damaged. In the most common form of the disorder, the damage is believed to occur because the immune system (cells that protect the body against foreign invaders, like bacteria and viruses) accidentally attacks the myelin. CIDP may accompany conditions such as blood disorders (Waldenstrom's macroglobulinemia, multiple myeloma, or osteosclerotic myeloma), diabetes, HIV, or hepatitis B or C infection. SYMPTOMS   Numbness or tingling of the hands and feet.  Weakness.  Problems walking.  Weak or absent reflexes. DIAGNOSIS  A physical exam will be done by your health care provider. Also, tests can be done to show how well your nerves are working. These tests include:   Nerve conduction studies.  Electromyography. A nerve biopsy may be done to remove a small piece of a nerve so it can be examined. TREATMENT  Treatment may include:  Steroids or other immunosuppressant drugs. These drugs can decrease inflammation of the nerves.  Immunoglobulin therapy. This decreases your immune system's activity against the nerves.  Blood plasma exchange. This can be done to remove harmful immune system elements from your blood.  Physical therapy. This may help improve symptoms.  Braces or orthotics. These may help support weakened limbs. HOME CARE INSTRUCTIONS   Take all your medicines as directed by your health care provider.  Notify your health care provider if have significant side effects of the drugs. Never stop the medicines without a discussion with your health care provider.  Follow through on physical therapy recommendations. SEEK MEDICAL CARE IF:  You  notice new symptoms.  You notice your symptoms worsening.   Document Released: 06/30/2002 Document Revised: 06/10/2013 Document Reviewed: 04/23/2013 Exit[employer] Patient Information 2014 [employer].  

## 2014-04-26 ENCOUNTER — Telehealth: Payer: Self-pay | Admitting: *Deleted

## 2014-04-26 NOTE — Telephone Encounter (Signed)
Looks like the pt has last IVIG 05-06-14 ( and will need another prescription for this).  Looking at last ofv note, (MM/NP) this is to be continued.  Will forward for order.  (Amb Ref HH).

## 2014-04-26 NOTE — Telephone Encounter (Signed)
I saw this patient in May. At that time he had two months left for IVIG. He was also on prednisone and we reduced the dose. Do you want to do additional rounds of IVIG for him?

## 2014-04-26 NOTE — Telephone Encounter (Signed)
[facility] We will continue for another 6 months.

## 2014-04-28 NOTE — Telephone Encounter (Signed)
[facility] 6 months of IVIG HH therapy per Dr. Anne HahnWillis (as per below) .  Also LMVM for Jeannette [facility]Betty Howard [facility] re[facility] this as well.

## 2014-04-28 NOTE — Telephone Encounter (Signed)
Per Derwood KaplanBetty [name], [facility], they will fax order for Dr. Anne HahnWillis to sign (relating to 6 mo continuation of IVIG).

## 2014-05-03 ENCOUNTER — Other Ambulatory Visit: Payer: Self-pay | Admitting: Neurology

## 2014-05-03 MED ORDER — IMMUNE GLOBULIN (HUMAN) 5 GM/100ML IV SOLN: 400.0000 mg/kg | Freq: Every day | INTRAVENOUS | Status: DC

## 2014-05-03 MED ORDER — IMMUNE GLOBULIN (HUMAN) 5 GM/100ML IV SOLN: 400.0000 mg/kg | INTRAVENOUS | Status: AC

## 2014-05-04 ENCOUNTER — Telehealth: Payer: Self-pay | Admitting: Neurology

## 2014-05-04 NOTE — Telephone Encounter (Signed)
I attempted to call back.

## 2014-05-04 NOTE — Telephone Encounter (Signed)
Norman [name] from [facility] returning Sandy's call regarding patient. Please return call and advise.

## 2014-05-05 NOTE — Telephone Encounter (Signed)
DONE

## 2014-05-11 ENCOUNTER — Encounter: Payer: Self-pay | Admitting: Neurology

## 2014-05-11 ENCOUNTER — Telehealth: Payer: Self-pay | Admitting: Neurology

## 2014-05-11 NOTE — Telephone Encounter (Signed)
Mikea from [facility] calling to get clarification on the quantity and directions for patient's Octagam, please return call and advise.

## 2014-05-11 NOTE — Telephone Encounter (Signed)
Reference number is [id number].

## 2014-05-11 NOTE — Telephone Encounter (Signed)
This is regarding IVIG therapy.  Will forward request to nurses for review.

## 2014-05-14 NOTE — Telephone Encounter (Signed)
Derwood KaplanBetty H. [facility] back.  I asked her what was needed.  I told her that [facility] was calling about dosing for octagam.  [facility] is taking care of this and not [facility].  She will call the number below and speak with them.   She will call back if needed.

## 2014-05-14 NOTE — Telephone Encounter (Signed)
I called [facility] and LMVM for Norman [name] in IV infusion re: dose for pt.

## 2014-06-15 ENCOUNTER — Encounter: Payer: Self-pay | Admitting: Adult Health

## 2014-06-15 ENCOUNTER — Ambulatory Visit (INDEPENDENT_AMBULATORY_CARE_PROVIDER_SITE_OTHER): Payer: 59 | Admitting: Adult Health

## 2014-06-15 VITALS — BP 138/84 | HR 81 | Ht 74.0 in | Wt 283.0 lb

## 2014-06-15 DIAGNOSIS — R269 Unspecified abnormalities of gait and mobility: Secondary | ICD-10-CM

## 2014-06-15 DIAGNOSIS — G6181 Chronic inflammatory demyelinating polyneuritis: Secondary | ICD-10-CM

## 2014-06-15 MED ORDER — PREDNISONE 20 MG PO TABS
20.0000 mg | ORAL_TABLET | Freq: Every day | ORAL | Status: DC
Start: 2014-06-15 — End: 2015-01-23

## 2014-06-15 NOTE — Progress Notes (Addendum)
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 He returns today for follow-up. The patient is currently receiving IVIG montly. His last dose was 2 weeks ago. He is also on prednisone 12.5 mg daily. The patient states in the last couple of weeks he feels that things have "gone backwards." He states that his dizziness has returned. Patient states that he has been SOB with minimal activity. The patient's  numbness has increased in the last couple of weeks. It starts mid-calf and extends down to the feet. He denies any changes in his strength just states that when he tries to complete activities he gets really fatigued and short of breath. Denies any recent Falls but does state that his walking is slower. He states that he feels like the swelling in his feet has gotten worse although he is unsure if this is physically true or just his perception.   HISTORY 03/10/14: history of a peripheral neuropathy consistent with CIDP. He returns today for follow up. The patient is on 20 mg of prednisone and receives IVIG infusions monthly. Patient states that the numbness in the arms and legs have improved. He states that he now only has numbness in his fingertips whereas before it was in half of his hand. The numbness in his legs now only goes to mid calf whereas before it was going to his thigh. Patient states that his leg strength is a lot better than it was but feels that it is not back to his baseline. Dizziness has improved. Patient had a couple of falls a few months ago but did not sustain any injuries. He uses a cane to help with ambulation. Denies any pain. Patient feels that overall he is doing better, only complains that he is fatigued and his lower legs are edematous. He is not sure if it is due to inactivity or medication or a combination of both. He states that since he  decreased prednisone to 20 mg he has noticed that the swelling is only in his ankles and feet whereas before it was extending up to the calf. He states that the swelling causes some discomfort when walking. Can usually only walk for several minutes before his feet start to hurt. 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 At night he tries to prop his legs up. No new medical issues since the last visit   REVIEW OF SYSTEMS: Full 14 system review of systems performed and notable only for:  Constitutional: Fatigue  Eyes: N/A Ear/Nose/Throat: N/A  Skin: N/A  Cardiovascular: Leg swelling  Respiratory: Shortness of breath  Gastrointestinal: N/A  Genitourinary: N/A Hematology/Lymphatic: N/A  Endocrine: N/A Musculoskeletal: Walking difficulty Allergy/Immunology: N/A  Neurological: Dizziness, numbness, weakness Psychiatric: N/A Sleep: Daytime sleepiness   ALLERGIES: Allergies  Allergen Reactions  . Penicillins     infant  . Prozac [Fluoxetine Hcl] Other (See Comments)    Made pt jittery    HOME MEDICATIONS: Outpatient Prescriptions Prior to Visit  Medication Sig Dispense Refill  . amLODipine (NORVASC) 10 MG tablet Take 1 tablet by mouth daily.      . Aspirin (ASPIR-81 PO) Take by mouth daily.      Marland Kitchen atorvastatin (LIPITOR) 20 MG tablet Take 1 tablet by mouth daily.      . cholecalciferol (VITAMIN D) 1000 UNITS tablet  Take 1,000 Units by mouth 2 (two) times daily.      Marland Kitchen gabapentin (NEURONTIN) 300 MG capsule Decrease to 1 tablet BID for 1 week then decrease to 1 tablet daily for 1 week then stop.      . Immune Globulin 5% (OCTAGAM) 5 GM/100ML SOLN Inject 55 g into the vein every 21 ( twenty-one) days.  36 mL  6  . Immune Globulin 5% (OCTAGAM) 5 GM/100ML SOLN Inject 55 g into the vein daily. IVIg for 2 doses once every 28 days  36 mL  6  . losartan (COZAAR) 100 MG tablet Take 1 tablet by mouth daily.      . predniSONE (DELTASONE) 10 MG tablet Take 1 tablet (10 mg  total) by mouth daily with breakfast.  90 tablet  3  . predniSONE (DELTASONE) 5 MG tablet 1.5 tablets every morning for 1 month then decrease to 1 tablet every morning.  45 tablet  3   No facility-administered medications prior to visit.    PAST MEDICAL HISTORY: Past Medical History  Diagnosis Date  . Hypercholesteremia   . HTN (hypertension)   . Neuropathy 07/24/2013  . 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 Abnormality of gait 09/28/2013    PAST SURGICAL HISTORY: Past Surgical History  Procedure Laterality Date  . Cholecystectomy  2007  . 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 Appendectomy  1973  . Abdominal surgery      Skin growth removed    FAMILY HISTORY: Family History  Problem Relation Age of Onset  . Cancer Father   . Stroke Mother     SOCIAL HISTORY: History   Social History  . Marital Status: Married    Spouse Name: [name]    Number of Children: 5  . Years of Education: HS   Occupational History  .      [employer]   Social History Main Topics  . Smoking status: Former Smoker    Types: Cigarettes    Quit date: 07/24/2008  . Smokeless tobacco: Never Used  . Alcohol Use: 2.0 oz/week    4 drink(s) per week     Comment: minimal amount  . Drug Use: No  . Sexual Activity: Not on file   Other Topics Concern  . Not on file   Social History Narrative   Patient is married Biomedical engineer) and lives at home with his son and daughter.   Patient has five children.   Patient is working full-time.   Patient has a high school education.   Patient is left-handed.   Patient drinks very little caffeine.      PHYSICAL EXAM  Filed Vitals:   06/15/14 1120  Height:  (1.88 m)  Weight: 283 lb (128.368 kg)   Body mass index is 36.32 kg/(m^2).  Generalized: Well developed, in no acute distress Skin: 2-3+ pitting edema   Neurological examination  Mentation: Alert oriented to time, place, history taking. Follows all  commands speech and language fluent Cranial nerve II-XII: Pupils were equal round reactive to light. Extraocular movements were full, visual field were full on confrontational test. Facial sensation and strength were normal. Head turning and shoulder shrug  were normal and symmetric. Motor: The motor testing reveals 5 over 5 strength of all 4 extremities. Good symmetric motor tone is noted throughout.  Sensory: Sensory testing is intact to soft touch on all 4 extremities. No evidence of extinction is noted.  Coordination: Cerebellar testing reveals good finger-nose-finger and  heel-to-shin bilaterally.  Gait and station: uses a Cane to ambulate, slightly wide based. Tandem gait is unsteady. Romberg is negative but unsteady. No drift is seen.  Reflexes: Deep tendon reflexes are symmetric and normal bilaterally.    DIAGNOSTIC DATA (LABS, IMAGING, TESTING) - I reviewed patient records, labs, notes, testing and imaging myself where available.      Component Value Date/Time   PROT 6.3 07/24/2013 0950    Lab Results  Component Value Date   HGBA1C 5.8* 07/24/2013    Lab Results  Component Value Date   TSH 1.300 07/24/2013      ASSESSMENT AND PLAN 63 y.o. year old male  has a past medical history of Hypercholesteremia; HTN (hypertension); Neuropathy (07/24/2013); CIDP (chronic inflammatory demyelinating polyneuropathy) (09/28/2013); and Abnormality of gait (09/28/2013). here with:  1. CIDP  Patient feels that his primary symptoms are slowly returning. He states that up until a couple of weeks ago he was doing fairly well and his symptoms had improved. His last IVIG was two weeks ago. At his previous visit the patient has inquired about reducing the prednisone. He is currently taking 12.5 mg daily. I have consulted with Dr. Anne Hahn and at this time we will increase the Prednisone back to 20 mg daily. Patient concerned that his swelling has increased in his legs. At the previous visit he had 2+  pitting edema and that is true for this visit as well. He has actually lost weight since the last visit.  I have advised the patient to make an appointment with his PCP regarding shortness of breath. Patient verbalizes understanding. If his symptoms worsen or if he develops new symptoms he should let us know. If his symptoms do not improve with prednisone and IVIG we may consider starting Imuran or Cellcept.  Patient should follow up with [place] in 1 month or sooner if needed.    Butch Penny, MSN, NP-C 06/15/2014, 11:20 AM [facility] [address], [address] [place], [place] [address] [phone]  Note: This document was prepared with digital dictation and possible smart phrase technology. Any transcriptional errors that result from this process are unintentional.

## 2014-06-15 NOTE — Patient Instructions (Signed)
Chronic Inflammatory Demyelinating Polyneuropathy Chronic inflammatory demyelinating polyneuropathy (CIDP) is a condition in which damage to nerves results in impaired nerve function. This may cause diminished or unusual sensations, abnormal muscle function, or both. CAUSES  CIDP is caused when the covering of nerves (myelin sheath) is damaged. In the most common form of the disorder, the damage is believed to occur because the immune system (cells that protect the body against foreign invaders, like bacteria and viruses) accidentally attacks the myelin. CIDP may accompany conditions such as blood disorders (Waldenstrom's macroglobulinemia, multiple myeloma, or osteosclerotic myeloma), diabetes, HIV, or hepatitis B or C infection. SYMPTOMS   Numbness or tingling of the hands and feet.  Weakness.  Problems walking.  Weak or absent reflexes. DIAGNOSIS  A physical exam will be done by your health care provider. Also, tests can be done to show how well your nerves are working. These tests include:   Nerve conduction studies.  Electromyography. A nerve biopsy may be done to remove a small piece of a nerve so it can be examined. TREATMENT  Treatment may include:  Steroids or other immunosuppressant drugs. These drugs can decrease inflammation of the nerves.  Immunoglobulin therapy. This decreases your immune system's activity against the nerves.  Blood plasma exchange. This can be done to remove harmful immune system elements from your blood.  Physical therapy. This may help improve symptoms.  Braces or orthotics. These may help support weakened limbs. HOME CARE INSTRUCTIONS   Take all your medicines as directed by your health care provider.  Notify your health care provider if have significant side effects of the drugs. Never stop the medicines without a discussion with your health care provider.  Follow through on physical therapy recommendations. SEEK MEDICAL CARE IF:  You  notice new symptoms.  You notice your symptoms worsening.   Document Released: 06/30/2002 Document Revised: 06/10/2013 Document Reviewed: 04/23/2013 [employer] Patient Information 2015 [employer]. This information is not intended to replace advice given to you by your health care provider. Make sure you discuss any questions you have with your health care provider.  

## 2014-06-15 NOTE — Progress Notes (Signed)
I have read the note, and I agree with the clinical assessment and plan.  Norman [name] KEITH   

## 2014-07-15 ENCOUNTER — Ambulatory Visit (INDEPENDENT_AMBULATORY_CARE_PROVIDER_SITE_OTHER): Payer: 59 | Admitting: Adult Health

## 2014-07-15 ENCOUNTER — Encounter: Payer: Self-pay | Admitting: Adult Health

## 2014-07-15 VITALS — BP 147/92 | HR 79 | Ht 74.0 in | Wt 289.0 lb

## 2014-07-15 DIAGNOSIS — G6181 Chronic inflammatory demyelinating polyneuritis: Secondary | ICD-10-CM

## 2014-07-15 NOTE — Patient Instructions (Signed)
Chronic Inflammatory Demyelinating Polyneuropathy Chronic inflammatory demyelinating polyneuropathy (CIDP) is a condition in which damage to nerves results in impaired nerve function. This may cause diminished or unusual sensations, abnormal muscle function, or both. CAUSES  CIDP is caused when the covering of nerves (myelin sheath) is damaged. In the most common form of the disorder, the damage is believed to occur because the immune system (cells that protect the body against foreign invaders, like bacteria and viruses) accidentally attacks the myelin. CIDP may accompany conditions such as blood disorders (Waldenstrom's macroglobulinemia, multiple myeloma, or osteosclerotic myeloma), diabetes, HIV, or hepatitis B or C infection. SYMPTOMS   Numbness or tingling of the hands and feet.  Weakness.  Problems walking.  Weak or absent reflexes. DIAGNOSIS  A physical exam will be done by your health care provider. Also, tests can be done to show how well your nerves are working. These tests include:   Nerve conduction studies.  Electromyography. A nerve biopsy may be done to remove a small piece of a nerve so it can be examined. TREATMENT  Treatment may include:  Steroids or other immunosuppressant drugs. These drugs can decrease inflammation of the nerves.  Immunoglobulin therapy. This decreases your immune system's activity against the nerves.  Blood plasma exchange. This can be done to remove harmful immune system elements from your blood.  Physical therapy. This may help improve symptoms.  Braces or orthotics. These may help support weakened limbs. HOME CARE INSTRUCTIONS   Take all your medicines as directed by your health care provider.  Notify your health care provider if have significant side effects of the drugs. Never stop the medicines without a discussion with your health care provider.  Follow through on physical therapy recommendations. SEEK MEDICAL CARE IF:  You  notice new symptoms.  You notice your symptoms worsening.   Document Released: 06/30/2002 Document Revised: 06/10/2013 Document Reviewed: 04/23/2013 [employer] Patient Information 2015 [employer]. This information is not intended to replace advice given to you by your health care provider. Make sure you discuss any questions you have with your health care provider.  

## 2014-07-15 NOTE — Progress Notes (Signed)
PATIENT: Norman [name] DOB: 1951/07/12  REASON FOR VISIT: follow up HISTORY FROM: patient  HISTORY OF PRESENT ILLNESS: Norman [name] is a 63 year old male with a history of Peripheral neuropathy consistent with CIDP. He returns today for followup. He is currently receiving IVIG and prednisone 20 mg. Patient sates that the numbness and tingling has remained the same. His continues to be limited to the lower extremities below the knee. He states that his walking has improved with the increase in prednisone. He states that his SOB has improved. He did see his PCP and they did a stress test which was normal. The patient continues to have some fatigue but he contribute that to not exercising and gaining weight. Denies any pain but occasionally will have some arthritic pain. His feet has remained swollen but no increase. His latest IVIG treatment was this past Monday.  HISTORY 03/10/14: history of a peripheral neuropathy consistent with CIDP. He returns today for follow up. The patient is on 20 mg of prednisone and receives IVIG infusions monthly. Patient states that the numbness in the arms and legs have improved. He states that he now only has numbness in his fingertips whereas before it was in half of his hand. The numbness in his legs now only goes to mid calf whereas before it was going to his thigh. Patient states that his leg strength is a lot better than it was but feels that it is not back to his baseline. Dizziness has improved. Patient had a couple of falls a few months ago but did not sustain any injuries. He uses a cane to help with ambulation. Denies any pain. Patient feels that overall he is doing better, only complains that he is fatigued and his lower legs are edematous. He is not sure if it is due to inactivity or medication or a combination of both. He states that since he decreased prednisone to 20 mg he has noticed that the swelling is only in his ankles and feet whereas before it was  extending up to the calf. He states that the swelling causes some discomfort when walking. Can usually only walk for several minutes before his feet start to hurt. 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 At night he tries to prop his legs up. No new medical issues since the last visit  REVIEW OF SYSTEMS: Full 14 system review of systems performed and notable only for:  Constitutional: Fatigue Eyes: N/A Ear/Nose/Throat: N/A  Skin: N/A  Cardiovascular: Leg swelling  Respiratory: Shortness of breath Gastrointestinal: N/A  Genitourinary: N/A Hematology/Lymphatic: Bruise/bleed easily  Endocrine: N/A Musculoskeletal: Walking difficulty  Allergy/Immunology: N/A  Neurological: Dizziness, numbness, weakness Psychiatric: N/A Sleep: N/A   ALLERGIES: Allergies  Allergen Reactions  . Penicillins     infant  . Prozac [Fluoxetine Hcl] Other (See Comments)    Made pt jittery    HOME MEDICATIONS: Outpatient Prescriptions Prior to Visit  Medication Sig Dispense Refill  . amLODipine (NORVASC) 10 MG tablet Take 1 tablet by mouth daily.      . Aspirin (ASPIR-81 PO) Take by mouth daily.      Marland Kitchen atorvastatin (LIPITOR) 20 MG tablet Take 1 tablet by mouth daily.      . cholecalciferol (VITAMIN D) 1000 UNITS tablet Take 1,000 Units by mouth 2 (two) times daily.      Marland Kitchen gabapentin (NEURONTIN) 300 MG capsule Decrease to 1 tablet BID for 1 week then decrease  to 1 tablet daily for 1 week then stop.      . Immune Globulin 5% (OCTAGAM) 5 GM/100ML SOLN Inject 55 g into the vein every 21 ( twenty-one) days.  36 mL  6  . Immune Globulin 5% (OCTAGAM) 5 GM/100ML SOLN Inject 55 g into the vein daily. IVIg for 2 doses once every 28 days  36 mL  6  . losartan (COZAAR) 100 MG tablet Take 1 tablet by mouth daily.      . predniSONE (DELTASONE) 20 MG tablet Take 1 tablet (20 mg total) by mouth daily.  30 tablet  3  . spironolactone (ALDACTONE) 25 MG tablet        No facility-administered  medications prior to visit.    PAST MEDICAL HISTORY: Past Medical History  Diagnosis Date  . Hypercholesteremia   . HTN (hypertension)   . Neuropathy 07/24/2013  . 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 Abnormality of gait 09/28/2013    PAST SURGICAL HISTORY: Past Surgical History  Procedure Laterality Date  . Cholecystectomy  2007  . 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 Appendectomy  1973  . Abdominal surgery      Skin growth removed    FAMILY HISTORY: Family History  Problem Relation Age of Onset  . Cancer Father   . Stroke Mother     SOCIAL HISTORY: History   Social History  . Marital Status: Married    Spouse Name: Norman [name]    Number of Children: 5  . Years of Education: HS   Occupational History  .      [employer]   Social History Main Topics  . Smoking status: Former Smoker    Types: Cigarettes    Quit date: 07/24/2008  . Smokeless tobacco: Never Used  . Alcohol Use: 2.0 oz/week    4 drink(s) per week     Comment: minimal amount  . Drug Use: No  . Sexual Activity: Not on file   Other Topics Concern  . Not on file   Social History Narrative   Patient is married Biomedical engineer) and lives at home with his son and daughter.   Patient has five children.   Patient is working full-time.   Patient has a high school education.   Patient is left-handed.   Patient drinks very little caffeine.      PHYSICAL EXAM  Filed Vitals:   07/15/14 1053  BP: 147/92  Pulse: 79  Height:  (1.88 m)  Weight: 289 lb (131.09 kg)   Body mass index is 37.09 kg/(m^2).  Generalized: Well developed, in no acute distress   Neurological examination  Mentation: Alert oriented to time, place, history taking. Follows all commands speech and language fluent Cranial nerve II-XII: Pupils were equal round reactive to light. Extraocular movements were full, visual field were full on confrontational test. Facial sensation and strength were  normal.  Uvula tongue midline. Head turning and shoulder shrug  were normal and symmetric. Motor: The motor testing reveals 5 over 5 strength of all 4 extremities. Good symmetric motor tone is noted throughout.  Sensory: Sensory testing is intact to soft touch on all 4 extremities. No evidence of extinction is noted.  Coordination: Cerebellar testing reveals good finger-nose-finger and heel-to-shin bilaterally.  Gait and station: Gait is normal, uses a cane to ambulate. Tandem gait is slightly unsteady. Romberg is negative. No drift is seen.  Reflexes: Deep tendon reflexes are symmetric and normal bilaterally.    DIAGNOSTIC  DATA (LABS, IMAGING, TESTING) - I reviewed patient records, labs, notes, testing and imaging myself where available.   ASSESSMENT AND PLAN 63 y.o. year old male  has a past medical history of Hypercholesteremia; HTN (hypertension); Neuropathy (07/24/2013); CIDP (chronic inflammatory demyelinating polyneuropathy) (09/28/2013); and Abnormality of gait (09/28/2013). here with:  1. CIDP  Patient has improved since increasing the Prednisone to 20 mg. He will continue taking the prednisone. We may consider tapering this down at his next visit if he continues to do well. He will continue IVIG. Encouraged the patient to eat healthier options since prednisone can increase your appetite. Also increase the patient in exercise daily, even if its in small increments. Patient verbalized understanding. If his symptoms worsen or if he develops new symptoms he should let us know. Otherwise he will followup in 3-4 months or sooner if needed.  Butch Penny, MSN, NP-C 07/15/2014, 11:20 AM [facility] [address], [address] [place], [place] [address] [phone]  Note: This document was prepared with digital dictation and possible smart phrase technology. Any transcriptional errors that result from this process are unintentional.

## 2014-07-16 NOTE — Progress Notes (Signed)
I have read the note, and I agree with the clinical assessment and plan.  WILLIS,CHARLES KEITH   

## 2014-07-29 ENCOUNTER — Encounter: Payer: Self-pay | Admitting: Adult Health

## 2014-08-02 ENCOUNTER — Telehealth: Payer: Self-pay | Admitting: *Deleted

## 2014-08-02 NOTE — Telephone Encounter (Signed)
Patient was scheduled with NP MM 10/05/14 at 11 am.

## 2014-09-13 ENCOUNTER — Telehealth: Payer: Self-pay | Admitting: Adult Health

## 2014-09-13 NOTE — Telephone Encounter (Signed)
Megan please advise what to do.

## 2014-09-13 NOTE — Telephone Encounter (Signed)
Fritzie from [facility] is calling regarding IVIG infusion, today was 1st treatment and tomorrow.  His 2nd treatment will be 12/10 and 12/11.  She can do one more treatment before year ends and that would be 12/29 and 12/30.  He has appointment with Dr. Anne HahnWillis on 12/31.  So would this last treatment be ok to do.  Please call back and advise.

## 2014-09-14 NOTE — Telephone Encounter (Signed)
The patient will have a third dose of IVIG before the end of the year. This will work out with his schedule for the IVIG.

## 2014-09-14 NOTE — Telephone Encounter (Signed)
Dr. Anne HahnWillis, Would you like him to have the 3rd dose of IVIG?

## 2014-10-05 ENCOUNTER — Ambulatory Visit: Payer: Self-pay | Admitting: Adult Health

## 2014-10-21 ENCOUNTER — Encounter: Payer: Self-pay | Admitting: Adult Health

## 2014-10-21 ENCOUNTER — Ambulatory Visit (INDEPENDENT_AMBULATORY_CARE_PROVIDER_SITE_OTHER): Payer: 59 | Admitting: Adult Health

## 2014-10-21 VITALS — BP 121/78 | HR 61 | Ht 74.0 in | Wt 288.6 lb

## 2014-10-21 DIAGNOSIS — R269 Unspecified abnormalities of gait and mobility: Secondary | ICD-10-CM

## 2014-10-21 DIAGNOSIS — G6181 Chronic inflammatory demyelinating polyneuritis: Secondary | ICD-10-CM

## 2014-10-21 NOTE — Progress Notes (Signed)
PATIENT: Norman DayDenis J [name] DOB: 07/27/1951  REASON FOR VISIT: follow up HISTORY FROM: patient  HISTORY OF PRESENT ILLNESS: Norman [name] is a 63 year old male with a history of peripheral neuropathy consistent with CIDP. He returns today for follow-up. The patient is currently receiving IVIG and takes prednisone 20 mg daily. The patient does have numbness and tingling that is limited to the lower extremities below the knee. He states that his balance and gait has remained the same. Denies any dizziness.  He had IVIG the last two days. He states that he has some numbness and tingling in the fingers bilaterally.  This has been ongoing but in the last week seems to have gotten slightly worse. He denies dropping things. He is not going to do IVIG for the next four months due to insurance. At the end of April he retires and will start IVIG again if needed. Patient does report a some days he has significant fatigue. He does state that he does not sleep well at night. He often "tosses and turns" all night long.  HISTORY 07/15/14: Norman [name] is a 63 year old male with a history of Peripheral neuropathy consistent with CIDP. He returns today for followup. He is currently receiving IVIG and prednisone 20 mg. Patient sates that the numbness and tingling has remained the same. His continues to be limited to the lower extremities below the knee. He states that his walking has improved with the increase in prednisone. He states that his SOB has improved. He did see his PCP and they did a stress test which was normal. The patient continues to have some fatigue but he contribute that to not exercising and gaining weight. Denies any pain but occasionally will have some arthritic pain. His feet has remained swollen but no increase. His latest IVIG treatment was this past Monday.  HISTORY 03/10/14: history of a peripheral neuropathy consistent with CIDP. He returns today for follow up. The patient is on 20 mg of prednisone  and receives IVIG infusions monthly. Patient states that the numbness in the arms and legs have improved. He states that he now only has numbness in his fingertips whereas before it was in half of his hand. The numbness in his legs now only goes to mid calf whereas before it was going to his thigh. Patient states that his leg strength is a lot better than it was but feels that it is not back to his baseline. Dizziness has improved. Patient had a couple of falls a few months ago but did not sustain any injuries. He uses a cane to help with ambulation. Denies any pain. Patient feels that overall he is doing better, only complains that he is fatigued and his lower legs are edematous. He is not sure if it is due to inactivity or medication or a combination of both. He states that since he decreased prednisone to 20 mg he has noticed that the swelling is only in his ankles and feet whereas before it was extending up to the calf. He states that the swelling causes some discomfort when walking. Can usually only walk for several minutes before his feet start to hurt. During the [name] he sits at a computer for 8-9 hours a [name] for work. At night he tries to prop his legs up. No new medical issues since the last visit   REVIEW OF SYSTEMS: Out of a complete 14 system review of symptoms, the patient complains only of the following symptoms, and all  other reviewed systems are negative.    ALLERGIES: Allergies  Allergen Reactions  . Penicillins     infant  . Prozac [Fluoxetine Hcl] Other (See Comments)    Made pt jittery    HOME MEDICATIONS: Outpatient Prescriptions Prior to Visit  Medication Sig Dispense Refill  . amLODipine (NORVASC) 10 MG tablet Take 1 tablet by mouth daily.    . Aspirin (ASPIR-81 PO) Take by mouth daily.    Marland Kitchen atorvastatin (LIPITOR) 20 MG tablet Take 1 tablet by mouth daily.    . cholecalciferol (VITAMIN D) 1000 UNITS tablet Take 1,000 Units by mouth 2 (two) times daily.    Marland Kitchen gabapentin  (NEURONTIN) 300 MG capsule Decrease to 1 tablet BID for 1 week then decrease to 1 tablet daily for 1 week then stop.    . Immune Globulin 5% (OCTAGAM) 5 GM/100ML SOLN Inject 55 g into the vein every 21 ( twenty-one) days. 36 mL 6  . Immune Globulin 5% (OCTAGAM) 5 GM/100ML SOLN Inject 55 g into the vein daily. IVIg for 2 doses once every 28 days 36 mL 6  . losartan (COZAAR) 100 MG tablet Take 1 tablet by mouth daily.    . predniSONE (DELTASONE) 20 MG tablet Take 1 tablet (20 mg total) by mouth daily. 30 tablet 3  . spironolactone (ALDACTONE) 25 MG tablet      No facility-administered medications prior to visit.    PAST MEDICAL HISTORY: Past Medical History  Diagnosis Date  . Hypercholesteremia   . HTN (hypertension)   . Neuropathy 07/24/2013  . 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 Abnormality of gait 09/28/2013    PAST SURGICAL HISTORY: Past Surgical History  Procedure Laterality Date  . Cholecystectomy  2007  . 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 Appendectomy  1973  . Abdominal surgery      Skin growth removed    FAMILY HISTORY: Family History  Problem Relation Age of Onset  . Cancer Father   . Stroke Mother     SOCIAL HISTORY: History   Social History  . Marital Status: Married    Spouse Name: Norman [name]    Number of Children: 5  . Years of Education: HS   Occupational History  .      [employer]   Social History Main Topics  . Smoking status: Former Smoker    Types: Cigarettes    Quit date: 07/24/2008  . Smokeless tobacco: Never Used  . Alcohol Use: 2.0 oz/week    4 drink(s) per week     Comment: minimal amount  . Drug Use: No  . Sexual Activity: Not on file   Other Topics Concern  . Not on file   Social History Narrative   Patient is married Biomedical engineer) and lives at home with his son and daughter.   Patient has five children.   Patient is working full-time.   Patient has a high school education.   Patient is  left-handed.   Patient drinks very little caffeine.      PHYSICAL EXAM  Filed Vitals:   10/21/14 0855  BP: 121/78  Pulse: 61  Height: 6\' 2"  (1.88 m)  Weight: 288 lb 9.6 oz (130.908 kg)   Body mass index is 37.04 kg/(m^2).  Generalized: Well developed, in no acute distress   Neurological examination  Mentation: Alert oriented to time, place, history taking. Follows all commands speech and language fluent Cranial nerve II-XII: Pupils were equal round reactive to light. Extraocular  movements were full, visual field were full on confrontational test. Facial sensation and strength were normal. Uvula tongue midline. Head turning and shoulder shrug were normal and symmetric. Motor: The motor testing reveals 5 over 5 strength of all 4 extremities. Good symmetric motor tone is noted throughout. 2-3+ pitting edema in the lower extremities bilaterally. Sensory: Sensory testing is intact to soft touch on all 4 extremities. No evidence of extinction is noted.  Coordination: Cerebellar testing reveals good finger-nose-finger and heel-to-shin bilaterally.  Gait and station: Gait is slightly wide-based, uses a cane to ambulate. Tandem gait is slightly unsteady. Romberg is negative. No drift is seen.  Reflexes: Deep tendon reflexes are symmetric and normal bilaterally  DIAGNOSTIC DATA (LABS, IMAGING, TESTING) - I reviewed patient records, labs, notes, testing and imaging myself where available.  No results found for: WBC, HGB, HCT, MCV, PLT    Component Value Date/Time   PROT 6.3 07/24/2013 0950   No results found for: CHOL, HDL, LDLCALC, LDLDIRECT, TRIG, CHOLHDL Lab Results  Component Value Date   HGBA1C 5.8* 07/24/2013   No results found for: [id number] Lab Results  Component Value Date   TSH 1.300 07/24/2013      ASSESSMENT AND PLAN 63 y.o. year old male  has a past medical history of Hypercholesteremia; HTN (hypertension); Neuropathy (07/24/2013); CIDP (chronic inflammatory  demyelinating polyneuropathy) (09/28/2013); and Abnormality of gait (09/28/2013). here with:  1. CIDP 2. Abnormality of gait  Overall the patient has remained about the same. He continues to take prednisone 20 mg daily. He does report some worsening of the numbness and tingling in the fingertips. I have advised patient that if this does not improve or continues to get worse to let us know. Patient also reports some trouble sleeping at night. Stating that he "tosses and turns" all night long. He feels that this may be contributing to his fatigue during the [name]. I advised the patient to try over-the-counter melatonin 5-10 mg at bedtime. If this is not beneficial he should let us know and we can try a sleeping agent. Patient verbalized understanding. If the patient's symptoms worsen or symptoms he should let us know. Otherwise he will follow-up in 4 months with Dr. Anne HahnWillis.  Butch PennyMegan Quantae Martel, MSN, NP-C 10/21/2014, 9:04 AM [facility] [address], [address] [place], [place] [address] [phone]  Note: This document was prepared with digital dictation and possible smart phrase technology. Any transcriptional errors that result from this process are unintentional.

## 2014-10-21 NOTE — Progress Notes (Signed)
I have read the note, and I agree with the clinical assessment and plan.  Blasa Raisch KEITH   

## 2014-10-21 NOTE — Patient Instructions (Signed)
Continue taking Prednisone 20mg  daily.  If the numbness/tingling in the fingertips gets worse let us know.  Try melatonin 5-10 mg at night to help with sleep. If this is not beneficial let us know and will can try a sleeping agent.

## 2014-11-15 ENCOUNTER — Ambulatory Visit: Payer: 59 | Admitting: Adult Health

## 2015-01-23 ENCOUNTER — Other Ambulatory Visit: Payer: Self-pay | Admitting: Neurology

## 2015-03-14 ENCOUNTER — Ambulatory Visit: Payer: 59 | Admitting: Neurology

## 2015-03-16 ENCOUNTER — Encounter: Payer: Self-pay | Admitting: Neurology

## 2015-03-16 ENCOUNTER — Ambulatory Visit (INDEPENDENT_AMBULATORY_CARE_PROVIDER_SITE_OTHER): Payer: Medicare Other | Admitting: Neurology

## 2015-03-16 VITALS — BP 127/79 | HR 55 | Ht 74.0 in | Wt 288.2 lb

## 2015-03-16 DIAGNOSIS — G6181 Chronic inflammatory demyelinating polyneuritis: Secondary | ICD-10-CM

## 2015-03-16 DIAGNOSIS — R609 Edema, unspecified: Secondary | ICD-10-CM

## 2015-03-16 DIAGNOSIS — R269 Unspecified abnormalities of gait and mobility: Secondary | ICD-10-CM

## 2015-03-16 MED ORDER — PREDNISONE 20 MG PO TABS: ORAL_TABLET | ORAL | Status: DC

## 2015-03-16 NOTE — Patient Instructions (Signed)

## 2015-03-16 NOTE — Progress Notes (Signed)
Reason for visit: CIDP  Norman [name] is an 64 y.o. male  History of present illness:  Norman [name] is a 64 year old right handed white male with CIDP. He has come off the IVIG in December 2015, he has had progression of his sensory symptoms over the next several months. In March 2016, he indicates that the sensory symptoms seemed to stabilize. He has numbness to the mid thigh level, and he has very little discomfort in the legs. He does have lots swelling and peripheral edema, he has some pain related to this. He denies any falls. The has not had any dizziness with standing as he did previously. While on IVIG, the numbness receded down to the knee level. He does have some numbness in the fingers, and he will drop things from the hands on occasion. He denies any issues controlling the bowels or the bladder. He returns for an evaluation. The last EMG and nerve conduction study was done in October 2014.  Past Medical History  Diagnosis Date  . Hypercholesteremia   . HTN (hypertension)   . Neuropathy 07/24/2013  . 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 Abnormality of gait 09/28/2013    Past Surgical History  Procedure Laterality Date  . Cholecystectomy  2007  . 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 Appendectomy  1973  . Abdominal surgery      Skin growth removed    Family History  Problem Relation Age of Onset  . Cancer Father   . Stroke Mother     Social history:  reports that he quit smoking about 6 years ago. His smoking use included Cigarettes. He has never used smokeless tobacco. He reports that he drinks about 2.4 oz of alcohol per week. He reports that he does not use illicit drugs.    Allergies  Allergen Reactions  . Penicillins     infant  . Prozac [Fluoxetine Hcl] Other (See Comments)    Made pt jittery    Medications:  Prior to Admission medications   Medication Sig Start Date End Date Taking? Authorizing Provider    amLODipine (NORVASC) 10 MG tablet Take 1 tablet by mouth daily. 07/13/13  Yes Historical Provider, MD  Aspirin (ASPIR-81 PO) Take by mouth daily.   Yes Historical Provider, MD  atorvastatin (LIPITOR) 20 MG tablet Take 1 tablet by mouth daily. 07/15/13  Yes Historical Provider, MD  cholecalciferol (VITAMIN D) 1000 UNITS tablet Take 1,000 Units by mouth 2 (two) times daily.   Yes Historical Provider, MD  gabapentin (NEURONTIN) 300 MG capsule Decrease to 1 tablet BID for 1 week then decrease to 1 tablet daily for 1 week then stop. 03/10/14  Yes Butch PennyMegan Millikan, NP  losartan (COZAAR) 100 MG tablet Take 1 tablet by mouth daily. 07/13/13  Yes Historical Provider, MD  predniSONE (DELTASONE) 20 MG tablet Take 1 tablet by mouth  daily 03/16/15  Yes York Spanielharles K Willis, MD  spironolactone (ALDACTONE) 25 MG tablet  06/01/14  Yes Historical Provider, MD  Immune Globulin 5% (OCTAGAM) 5 GM/100ML SOLN Inject 55 g into the vein every 21 ( twenty-one) days. Patient not taking: Reported on 03/16/2015 05/03/14   York Spanielharles K Willis, MD    ROS:  Out of a complete 14 system review of symptoms, the patient complains only of the following symptoms, and all other reviewed systems are negative.  Fatigue Numbness, weakness Gait disturbance  Blood pressure 127/79, pulse 55, height 6\' 2"  (1.88 m), weight  288 lb 3.2 oz (130.727 kg).  Physical Exam  General: The patient is alert and cooperative at the time of the examination. The patient is markedly obese.  Skin: 3+ edema is noted below the knees bilaterally.   Neurologic Exam  Mental status: The patient is alert and oriented x 3 at the time of the examination. The patient has apparent normal recent and remote memory, with an apparently normal attention span and concentration ability.   Cranial nerves: Facial symmetry is present. Speech is normal, no aphasia or dysarthria is noted. Extraocular movements are full. Visual fields are full.  Motor: The patient has good  strength in all 4 extremities.  Sensory examination: Soft touch sensation is symmetric on the face, arms, and legs. The patient has a significant reduction in soft touch and pinprick sensation below the knees.  Coordination: The patient has good finger-nose-finger and heel-to-shin bilaterally.  Gait and station: The patient has a slightly wide-based gait, the patient usually walks with a cane. Tandem gait was not tested. Romberg is negative. No drift is seen.  Reflexes: Deep tendon reflexes are symmetric, but are absent throughout.   Assessment/Plan:  1. CIDP  2. Gait disturbance  The patient will follow-up in about 4 or 5 months. At that time, we will consider getting a repeat nerve conduction study to compare to the prior study done in 2014. The patient will contact our office if he believes that he is progressing. The patient will remain on the prednisone at 20 mg daily, prescription was called in.  Marlan Palau MD 03/16/2015 8:53 PM  [facility] [address] [place], [place] [address]  Phone [phone] Fax [phone]

## 2015-03-18 ENCOUNTER — Telehealth: Payer: Self-pay | Admitting: Neurology

## 2015-03-18 NOTE — Telephone Encounter (Signed)
Everlean AlstromJill Wilson from [facility] called wanting to confirm the script for compression stockings. Noreene LarssonJill needs to know what compression or what type. Please call and advise. Noreene LarssonJill can be reached @ [phone]

## 2015-03-18 NOTE — Telephone Encounter (Signed)
[facility] and spoke to [place]. Dr. Anne HahnWillis wants the patient to have 8-15 compression and thigh high stockings. She read back the order correctly.

## 2015-06-12 ENCOUNTER — Emergency Department (HOSPITAL_COMMUNITY): Payer: No Typology Code available for payment source

## 2015-06-12 ENCOUNTER — Emergency Department (HOSPITAL_COMMUNITY)
Admission: EM | Admit: 2015-06-12 | Discharge: 2015-06-12 | Disposition: A | Discharge status: Home / Self Care | Payer: No Typology Code available for payment source | Attending: Emergency Medicine | Admitting: Emergency Medicine

## 2015-06-12 ENCOUNTER — Encounter (HOSPITAL_COMMUNITY): Payer: Self-pay | Admitting: *Deleted

## 2015-06-12 DIAGNOSIS — Y9389 Activity, other specified: Secondary | ICD-10-CM | POA: Diagnosis not present

## 2015-06-12 DIAGNOSIS — Y998 Other external cause status: Secondary | ICD-10-CM | POA: Diagnosis not present

## 2015-06-12 DIAGNOSIS — Z79899 Other long term (current) drug therapy: Secondary | ICD-10-CM | POA: Diagnosis not present

## 2015-06-12 DIAGNOSIS — E780 Pure hypercholesterolemia: Secondary | ICD-10-CM | POA: Insufficient documentation

## 2015-06-12 DIAGNOSIS — Y92410 Unspecified street and highway as the place of occurrence of the external cause: Secondary | ICD-10-CM | POA: Diagnosis not present

## 2015-06-12 DIAGNOSIS — S0990XA Unspecified injury of head, initial encounter: Secondary | ICD-10-CM | POA: Insufficient documentation

## 2015-06-12 DIAGNOSIS — I10 Essential (primary) hypertension: Secondary | ICD-10-CM | POA: Diagnosis not present

## 2015-06-12 DIAGNOSIS — Z880 Allergy status to penicillin: Secondary | ICD-10-CM | POA: Diagnosis not present

## 2015-06-12 DIAGNOSIS — Z7982 Long term (current) use of aspirin: Secondary | ICD-10-CM | POA: Diagnosis not present

## 2015-06-12 DIAGNOSIS — Z87891 Personal history of nicotine dependence: Secondary | ICD-10-CM | POA: Insufficient documentation

## 2015-06-12 DIAGNOSIS — Z8669 Personal history of other diseases of the nervous system and sense organs: Secondary | ICD-10-CM | POA: Insufficient documentation
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
## 2015-06-19 ENCOUNTER — Encounter: Payer: Self-pay | Admitting: Neurology

## 2015-06-20 NOTE — Telephone Encounter (Signed)
I called the patient. Over the last month it has become more difficult for him to walk. He would like to come in and see Dr. Anne Hahn before his appointment that was scheduled in October. I scheduled him for 9/7 and will call him if we have any cancellations between now and then.

## 2015-06-29 ENCOUNTER — Telehealth: Payer: Self-pay | Admitting: Neurology

## 2015-06-29 ENCOUNTER — Encounter: Payer: Self-pay | Admitting: Neurology

## 2015-06-29 ENCOUNTER — Ambulatory Visit (INDEPENDENT_AMBULATORY_CARE_PROVIDER_SITE_OTHER): Payer: Medicare Other | Admitting: Neurology

## 2015-06-29 VITALS — BP 119/72 | HR 55 | Ht 74.0 in | Wt 293.0 lb

## 2015-06-29 DIAGNOSIS — R269 Unspecified abnormalities of gait and mobility: Secondary | ICD-10-CM

## 2015-06-29 DIAGNOSIS — G6181 Chronic inflammatory demyelinating polyneuritis: Secondary | ICD-10-CM | POA: Diagnosis not present

## 2015-06-29 MED ORDER — DULOXETINE HCL 30 MG PO CPEP: ORAL_CAPSULE | ORAL | Status: DC

## 2015-06-29 NOTE — Patient Instructions (Addendum)
We will set you up for a repeat EMG and NCV study. I will initiate the process of getting you back on IVIg.   Fall Prevention and Home Safety Falls cause injuries and can affect all age groups. It is possible to use preventive measures to significantly decrease the likelihood of falls. There are many simple measures which can make your home safer and prevent falls. OUTDOORS  Repair cracks and edges of walkways and driveways.  Remove high doorway thresholds.  Trim shrubbery on the main path into your home.  Have good outside lighting.  Clear walkways of tools, rocks, debris, and clutter.  Check that handrails are not broken and are securely fastened. Both sides of steps should have handrails.  Have leaves, snow, and ice cleared regularly.  Use sand or salt on walkways during winter months.  In the garage, clean up grease or oil spills. BATHROOM  Install night lights.  Install grab bars by the toilet and in the tub and shower.  Use non-skid mats or decals in the tub or shower.  Place a plastic non-slip stool in the shower to sit on, if needed.  Keep floors dry and clean up all water on the floor immediately.  Remove soap buildup in the tub or shower on a regular basis.  Secure bath mats with non-slip, double-sided rug tape.  Remove throw rugs and tripping hazards from the floors. BEDROOMS  Install night lights.  Make sure a bedside light is easy to reach.  Do not use oversized bedding.  Keep a telephone by your bedside.  Have a firm chair with side arms to use for getting dressed.  Remove throw rugs and tripping hazards from the floor. KITCHEN  Keep handles on pots and pans turned toward the center of the stove. Use back burners when possible.  Clean up spills quickly and allow time for drying.  Avoid walking on wet floors.  Avoid hot utensils and knives.  Position shelves so they are not too high or low.  Place commonly used objects within easy  reach.  If necessary, use a sturdy step stool with a grab bar when reaching.  Keep electrical cables out of the way.  Do not use floor polish or wax that makes floors slippery. If you must use wax, use non-skid floor wax.  Remove throw rugs and tripping hazards from the floor. STAIRWAYS  Never leave objects on stairs.  Place handrails on both sides of stairways and use them. Fix any loose handrails. Make sure handrails on both sides of the stairways are as long as the stairs.  Check carpeting to make sure it is firmly attached along stairs. Make repairs to worn or loose carpet promptly.  Avoid placing throw rugs at the top or bottom of stairways, or properly secure the rug with carpet tape to prevent slippage. Get rid of throw rugs, if possible.  Have an electrician put in a light switch at the top and bottom of the stairs. OTHER FALL PREVENTION TIPS  Wear low-heel or rubber-soled shoes that are supportive and fit well. Wear closed toe shoes.  When using a stepladder, make sure it is fully opened and both spreaders are firmly locked. Do not climb a closed stepladder.  Add color or contrast paint or tape to grab bars and handrails in your home. Place contrasting color strips on first and last steps.  Learn and use mobility aids as needed. Install an electrical emergency response system.  Turn on lights to avoid dark  areas. Replace light bulbs that burn out immediately. Get light switches that glow.  Arrange furniture to create clear pathways. Keep furniture in the same place.  Firmly attach carpet with non-skid or double-sided tape.  Eliminate uneven floor surfaces.  Select a carpet pattern that does not visually hide the edge of steps.  Be aware of all pets. OTHER HOME SAFETY TIPS  Set the water temperature for 120 F (48.8 C).  Keep emergency numbers on or near the telephone.  Keep smoke detectors on every level of the home and near sleeping areas. Document Released:  09/28/2002 Document Revised: 04/08/2012 Document Reviewed: 12/28/2011 [facility] Patient Information 2015 [place], [place]. This information is not intended to replace advice given to you by your health care provider. Make sure you discuss any questions you have with your health care provider.

## 2015-06-29 NOTE — Telephone Encounter (Signed)
I received blood work results done by the primary care physician, a comprehensive metabolic profile showed a creatinine of 1.32, BUN of 19 with a GFR of 56. Liver profile is unremarkable, total bilirubin was slightly elevated at 1.6. Hemoglobin A1c was 6.2. CBC was completely normal. Blood work was done on 06/27/2015.

## 2015-06-29 NOTE — Progress Notes (Addendum)
Reason for visit: CIDP  Norman [name] is an 64 y.o. male  History of present illness:  Mr. Norman [name] is a 64 year old right-handed white male with a history of a peripheral neuropathy felt consistent with CIDP. He remains on prednisone, 20 mg daily, and he went off of IVIG in December 2015. Over the next 3 months, he felt as if the sensory changes were progressing from just below the knee to mid thigh level. He seemed to stabilize for several months, but within the last 2 months, he has had some new symptoms. He feels that the legs are becoming weaker, he indicates that if he bends the knees while standing, he will tend to collapse. He has not actually had any falls, he is using a cane for ambulation. The numbness has remained at the thigh level, but he is now having new sensory alterations in the fingers, he is dropping things on occasions. He is not sleeping well because of increased discomfort in the feet. He has excessive daytime drowsiness because of this. In general, he believes that there has been a significant alteration in his functional level over the last couple months. His symptoms are worse when he is overheated. He returns for an evaluation. His last EMG and nerve conduction study was in 2014. The patient has gone off of gabapentin, the swelling the legs remains significant. He did not initially believe that this worsened his discomfort in the legs.  Past Medical History  Diagnosis Date  . Hypercholesteremia   . HTN (hypertension)   . Neuropathy 07/24/2013  . 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 Abnormality of gait 09/28/2013    Past Surgical History  Procedure Laterality Date  . Cholecystectomy  2007  . 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 Appendectomy  1973  . Abdominal surgery      Skin growth removed    Family History  Problem Relation Age of Onset  . Cancer Father   . Stroke Mother   . Heart disease Brother   . Cancer Sister       breast cancer  . Heart disease Brother     Social history:  reports that he quit smoking about 6 years ago. His smoking use included Cigarettes. He has never used smokeless tobacco. He reports that he drinks about 4.8 oz of alcohol per week. He reports that he does not use illicit drugs.    Allergies  Allergen Reactions  . Penicillins     childhood  . Prozac [Fluoxetine Hcl] Other (See Comments)    Made pt jittery    Medications:  Prior to Admission medications   Medication Sig Start Date End Date Taking? Authorizing Provider  amLODipine (NORVASC) 10 MG tablet Take 1 tablet by mouth daily. 07/13/13  Yes Historical Provider, MD  aspirin EC 81 MG tablet Take 81 mg by mouth daily.   Yes Historical Provider, MD  atorvastatin (LIPITOR) 20 MG tablet Take 1 tablet by mouth daily. 07/15/13  Yes Historical Provider, MD  cholecalciferol (VITAMIN D) 1000 UNITS tablet Take 1,000 Units by mouth 2 (two) times daily.   Yes Historical Provider, MD  Immune Globulin 5% (OCTAGAM) 5 GM/100ML SOLN Inject 55 g into the vein every 21 ( twenty-one) days. 05/03/14  Yes York Spaniel, MD  losartan (COZAAR) 100 MG tablet Take 1 tablet by mouth daily. 07/13/13  Yes Historical Provider, MD  predniSONE (DELTASONE) 20 MG tablet Take 1 tablet by mouth  daily Patient  taking differently: Take 20 mg by mouth daily with breakfast.  03/16/15  Yes York Spaniel, MD  spironolactone (ALDACTONE) 25 MG tablet Take 25 mg by mouth daily.  06/01/14  Yes Historical Provider, MD    ROS:  Out of a complete 14 system review of symptoms, the patient complains only of the following symptoms, and all other reviewed systems are negative.  Daytime sleepiness Muscle cramps, gait instability Numbness, weakness  Blood pressure 119/72, pulse 55, height 6\' 2"  (1.88 m), weight 293 lb (132.904 kg).  Physical Exam  General: The patient is alert and cooperative at the time of the examination. The patient is moderately to markedly  obese.  Skin: 3+ edema is noted below the knees bilaterally.   Neurologic Exam  Mental status: The patient is alert and oriented x 3 at the time of the examination. The patient has apparent normal recent and remote memory, with an apparently normal attention span and concentration ability.   Cranial nerves: Facial symmetry is present. Speech is normal, no aphasia or dysarthria is noted. Extraocular movements are full. Visual fields are full.  Motor: The patient has good strength in all 4 extremities to direct testing, but the patient is not able to walk on heels or the toes bilaterally.  Sensory examination: Soft touch sensation is symmetric on the face, arms, and legs. The patient has a stocking pattern pinprick sensory deficit up to the knees bilaterally.  Coordination: The patient has good finger-nose-finger and heel-to-shin bilaterally.  Gait and station: The patient has a slightly wide-based gait. The patient uses a cane for ambulation. Tandem gait was not attempted. The patient is able to stand from a seated position with the arms crossed with some difficulty. Romberg is negative, but is slightly unsteady. No drift is seen.  Reflexes: Deep tendon reflexes are symmetric, but are absent throughout.   Assessment/Plan:  1. CIDP  2. Gait disturbance  The patient will be placed on Cymbalta for the neuropathic discomfort. The patient will be set up for nerve conduction studies on all 4 extremities, EMG of the left leg. The patient will be placed back on IVIG given his worsening symptoms. The patient believes that his symptoms improved on the medication. He will follow-up for the EMG. I have signed a form for [employer] for handicapped parking. His primary care physician has just checked and all blood work to include a CBC and a chemistry panel, liver panel. I have asked the patient to have his primary care physician fax the blood work results to our office. The patient will remain on 20 mg of  prednisone.  Marlan Palau MD 06/29/2015 7:58 AM  [facility] [address] [place], [place] [address]  Phone [phone] Fax [phone]

## 2015-07-21 ENCOUNTER — Ambulatory Visit (INDEPENDENT_AMBULATORY_CARE_PROVIDER_SITE_OTHER): Payer: Medicare Other | Admitting: Neurology

## 2015-07-21 ENCOUNTER — Ambulatory Visit (INDEPENDENT_AMBULATORY_CARE_PROVIDER_SITE_OTHER): Payer: Self-pay | Admitting: Neurology

## 2015-07-21 ENCOUNTER — Encounter: Payer: Self-pay | Admitting: Neurology

## 2015-07-21 DIAGNOSIS — G6181 Chronic inflammatory demyelinating polyneuritis: Secondary | ICD-10-CM

## 2015-07-21 DIAGNOSIS — R269 Unspecified abnormalities of gait and mobility: Secondary | ICD-10-CM

## 2015-07-21 NOTE — Progress Notes (Signed)
Please refer to EMG and nerve conduction study procedure note. 

## 2015-07-21 NOTE — Procedures (Signed)
     HISTORY:  Norman [name] is a 63 year old gentleman with a history of CIDP. He has gone off of IVIG in December 2015, he has noted some gradual progression of symptoms with numbness and gait instability off the medication, he has just recently restarted IVIG. He comes in for an evaluation and follow-up for the neuropathy.  NERVE CONDUCTION STUDIES:  Nerve conduction studies were performed on all 4 extremities. The distal motor latencies and motor amplitudes for the median and ulnar nerves were normal bilaterally. The F wave latencies for the median nerves were borderline normal on the right, slightly prolonged on the left, borderline normal for the right ulnar nerve, and normal for the left ulnar nerve. The sensory latencies for the median nerves were prolonged on the left, normal on the right, normal for the ulnar nerves bilaterally.   Nerve conduction studies done on both lower extremities showed no response for the peroneal nerves bilaterally. The distal motor latencies for the posterior tibial nerves were normal bilaterally with low motor amplitudes for these nerves bilaterally. Normal nerve conduction velocities were seen for these nerves. The sensory latencies for the sural and peroneal nerves were absent bilaterally. The H reflex latencies were absent bilaterally.  EMG STUDIES:  EMG study was performed on the right lower extremity:  The tibialis anterior muscle reveals 2 to 5K motor units with slightly reduced recruitment. No fibrillations or positive waves were seen. The peroneus tertius muscle reveals 2 to 5K motor units with slightly reduced recruitment. No fibrillations or positive waves were seen. The medial gastrocnemius muscle reveals 1 to 3K motor units with full recruitment. No fibrillations or positive waves were seen. The vastus lateralis muscle reveals 2 to 4K motor units with full recruitment. No fibrillations or positive waves were seen. The iliopsoas muscle reveals 2 to  4K motor units with full recruitment. No fibrillations or positive waves were seen. The biceps femoris muscle (long head) reveals 2 to 4K motor units with full recruitment. No fibrillations or positive waves were seen. The lumbosacral paraspinal muscles were tested at 3 levels, and revealed 1+ positive waves at all 3 levels tested. There was good relaxation.   IMPRESSION:  Nerve conduction studies done on all 4 extremities shows evidence of a borderline left carpal tunnel syndrome. There appears to be evidence of a significant peripheral neuropathy, without clear demyelinating features at this point. EMG evaluation of the right lower extremity however shows only minimal chronic stable denervation below the knee. No active denervation is seen in the lower extremity. In comparison to a prior study done on 07/28/2013, the nerve function in the upper extremities appears to have improved, there is some progression in the neuropathy in the lower extremities.  Marlan Palau MD 07/21/2015 1:56 PM  [facility] [address] [place], [place] [address]  Phone [phone] Fax [phone]

## 2015-07-22 ENCOUNTER — Telehealth: Payer: Self-pay | Admitting: Neurology

## 2015-07-22 NOTE — Telephone Encounter (Signed)
Left a message for pt to call to schedule 6 month f/u with Dr. Anne Hahn.

## 2015-08-16 ENCOUNTER — Ambulatory Visit: Payer: Medicare Other | Admitting: Neurology

## 2015-10-29 ENCOUNTER — Other Ambulatory Visit: Payer: Self-pay | Admitting: Neurology

## 2015-12-31 ENCOUNTER — Other Ambulatory Visit: Payer: Self-pay | Admitting: Neurology

## 2016-01-04 ENCOUNTER — Encounter: Payer: Self-pay | Admitting: Neurology

## 2016-01-10 ENCOUNTER — Ambulatory Visit (INDEPENDENT_AMBULATORY_CARE_PROVIDER_SITE_OTHER): Payer: Medicare Other | Admitting: Neurology

## 2016-01-10 ENCOUNTER — Telehealth: Payer: Self-pay | Admitting: Neurology

## 2016-01-10 ENCOUNTER — Encounter: Payer: Self-pay | Admitting: Neurology

## 2016-01-10 VITALS — BP 132/93 | HR 81 | Ht 74.0 in | Wt 280.5 lb

## 2016-01-10 DIAGNOSIS — G6181 Chronic inflammatory demyelinating polyneuritis: Secondary | ICD-10-CM | POA: Diagnosis not present

## 2016-01-10 DIAGNOSIS — R269 Unspecified abnormalities of gait and mobility: Secondary | ICD-10-CM

## 2016-01-10 MED ORDER — DULOXETINE HCL 60 MG PO CPEP: 60.0000 mg | ORAL_CAPSULE | Freq: Two times a day (BID) | ORAL | Status: DC

## 2016-01-10 MED ORDER — DULOXETINE HCL 60 MG PO CPEP: 60.0000 mg | ORAL_CAPSULE | Freq: Every day | ORAL | Status: DC

## 2016-01-10 NOTE — Telephone Encounter (Signed)
Patient is calling and states he went to pick up his Rx duloxetine 60 mg tablets and the dosage instructions were different than discussed with Dr. Anne HahnWillis.  Chart states after 2 wks convert to 60 mg tablets 2 times/day.  Instructions on Rx state after 2 weeks take 1 60 mg capsules per day.  Please confirm with patient/pharmacy.  Thanks!

## 2016-01-10 NOTE — Progress Notes (Addendum)
Reason for visit: CIDP  Norman [name] is an 65 y.o. male  History of present illness:  Norman [name] is a 65 year old right-handed white male with a history of CIDP. The patient has had progression off of IVIG, he has been back on the medication for several months, he has already had improvement with numbness in the hands, his handwriting has improved, his ability to use his hands has improved. The numbness in the legs is gone from mid thigh level to the knee. He still has some gait instability, uses a cane for ambulation. He has occasional falls. He is having some discomfort, he was placed on Cymbalta, but this has not really helped him much at the 30 mg twice daily dose. He has his nights and days inverted, he tends to sleep during the daytime, and stays awake at night. He has significant fatigue with minimal activity. He may have some dizziness if he stands up too quickly. He returns to this office for an evaluation.  Past Medical History  Diagnosis Date  . Hypercholesteremia   . HTN (hypertension)   . Neuropathy (HCC) 07/24/2013  . 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 Abnormality of gait 09/28/2013    Past Surgical History  Procedure Laterality Date  . Cholecystectomy  2007  . 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 Appendectomy  1973  . Abdominal surgery      Skin growth removed    Family History  Problem Relation Age of Onset  . Cancer Father   . Stroke Mother   . Heart disease Brother   . Cancer Sister     breast cancer  . Heart disease Brother     Social history:  reports that he quit smoking about 7 years ago. His smoking use included Cigarettes. He has never used smokeless tobacco. He reports that he drinks about 4.8 oz of alcohol per week. He reports that he does not use illicit drugs.    Allergies  Allergen Reactions  . Penicillins     childhood  . Prozac [Fluoxetine Hcl] Other (See Comments)    Made pt jittery     Medications:  Prior to Admission medications   Medication Sig Start Date End Date Taking? Authorizing Provider  amLODipine (NORVASC) 10 MG tablet Take 1 tablet by mouth daily. 07/13/13  Yes Historical Provider, MD  aspirin EC 81 MG tablet Take 81 mg by mouth daily.   Yes Historical Provider, MD  atorvastatin (LIPITOR) 20 MG tablet Take 1 tablet by mouth daily. 07/15/13  Yes Historical Provider, MD  cholecalciferol (VITAMIN D) 1000 UNITS tablet Take 1,000 Units by mouth 2 (two) times daily.   Yes Historical Provider, MD  DULoxetine (CYMBALTA) 30 MG capsule TAKE 1 CAPSULE DAILY FOR 2 WEEKS, THEN TAKE 1 CAPSULE TWICE DAILY THEREAFTER 01/02/16  Yes Norman Spaniel, MD  Immune Globulin 5% (OCTAGAM) 5 GM/100ML SOLN Inject 55 g into the vein every 21 ( twenty-one) days. 05/03/14  Yes Norman Spaniel, MD  losartan (COZAAR) 100 MG tablet Take 1 tablet by mouth daily. 07/13/13  Yes Historical Provider, MD  metFORMIN (GLUCOPHAGE) 500 MG tablet Take 500 mg by mouth 2 (two) times daily. 01/03/16  Yes Historical Provider, MD  predniSONE (DELTASONE) 20 MG tablet Take 1 tablet by mouth  daily Patient taking differently: Take 20 mg by mouth daily with breakfast.  03/16/15  Yes Norman Spaniel, MD  spironolactone (ALDACTONE) 25 MG tablet Take 25 mg  by mouth daily.  06/01/14  Yes Historical Provider, MD    ROS:  Out of a complete 14 system review of symptoms, the patient complains only of the following symptoms, and all other reviewed systems are negative.  Gait disturbance Leg pain  Blood pressure 132/93, pulse 81, height 6\' 2"  (1.88 m), weight 280 lb 8 oz (127.234 kg).  Physical Exam  General: The patient is alert and cooperative at the time of the examination.  Skin: 2+ edema below the knees is noted bilaterally.   Neurologic Exam  Mental status: The patient is alert and oriented x 3 at the time of the examination. The patient has apparent normal recent and remote memory, with an apparently normal  attention span and concentration ability.   Cranial nerves: Facial symmetry is present. Speech is normal, no aphasia or dysarthria is noted. Extraocular movements are full. Visual fields are full.  Motor: The patient has good strength in all 4 extremities. The patient is able to walk on heels and the toes bilaterally.  Sensory examination: Soft touch sensation is symmetric on the face, arms, and legs.  Coordination: The patient has good finger-nose-finger and heel-to-shin bilaterally.  Gait and station: The patient has a slightly wide-based gait, patient uses a cane. Tandem gait is unsteady. Romberg is negative. No drift is seen.  Reflexes: Deep tendon reflexes are symmetric, but are depressed to absent throughout.   Assessment/Plan:  1. CIDP  2. Gait disturbance  The patient is having ongoing neuropathy pain, we will increase the Cymbalta, gradually going up to 60 mg twice daily. The patient was given a prescription for the 60 mg capsules. If this is not effective, he is to contact our office. 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 He remains on prednisone 20 mg daily. He will follow-up in 6 months. This patient has a modified Rankin score of 2.  Marlan Palau. Keith Aqeel Norgaard MD 01/10/2016 7:50 PM  [facility] [address] [place], [place] [id number]  Phone [phone] Fax [phone]

## 2016-01-10 NOTE — Telephone Encounter (Signed)
Called pt and no answer, LMVM.  [facility] relayed the plan of 30mg  po am cymbalta and 60mg  (2- 30mg  caps) po PM do this for 2 wks, then start 60mg  po bid.  (he may use up his 30mg  caps first) then go to 60mg  caps.  She would let pt know, but I would call in am as well.

## 2016-01-10 NOTE — Telephone Encounter (Signed)
The instructions on the cymbalta are incorrect, I will call in another Rx

## 2016-01-10 NOTE — Patient Instructions (Signed)
With the cymbalta 30 mg take one in the morning and 2 in the evening for 2 weeks, then convert to the 60 mg tablets twice a day.   Fall Prevention in the Home  Falls can cause injuries and can affect people from all age groups. There are many simple things that you can do to make your home safe and to help prevent falls. WHAT CAN I DO ON THE OUTSIDE OF MY HOME?  Regularly repair the edges of walkways and driveways and fix any cracks.  Remove high doorway thresholds.  Trim any shrubbery on the main path into your home.  Use bright outdoor lighting.  Clear walkways of debris and clutter, including tools and rocks.  Regularly check that handrails are securely fastened and in good repair. Both sides of any steps should have handrails.  Install guardrails along the edges of any raised decks or porches.  Have leaves, snow, and ice cleared regularly.  Use sand or salt on walkways during winter months.  In the garage, clean up any spills right away, including grease or oil spills. WHAT CAN I DO IN THE BATHROOM?  Use night lights.  Install grab bars by the toilet and in the tub and shower. Do not use towel bars as grab bars.  Use non-skid mats or decals on the floor of the tub or shower.  If you need to sit down while you are in the shower, use a plastic, non-slip stool.Marland Kitchen  Keep the floor dry. Immediately clean up any water that spills on the floor.  Remove soap buildup in the tub or shower on a regular basis.  Attach bath mats securely with double-sided non-slip rug tape.  Remove throw rugs and other tripping hazards from the floor. WHAT CAN I DO IN THE BEDROOM?  Use night lights.  Make sure that a bedside light is easy to reach.  Do not use oversized bedding that drapes onto the floor.  Have a firm chair that has side arms to use for getting dressed.  Remove throw rugs and other tripping hazards from the floor. WHAT CAN I DO IN THE KITCHEN?   Clean up any spills  right away.  Avoid walking on wet floors.  Place frequently used items in easy-to-reach places.  If you need to reach for something above you, use a sturdy step stool that has a grab bar.  Keep electrical cables out of the way.  Do not use floor polish or wax that makes floors slippery. If you have to use wax, make sure that it is non-skid floor wax.  Remove throw rugs and other tripping hazards from the floor. WHAT CAN I DO IN THE STAIRWAYS?  Do not leave any items on the stairs.  Make sure that there are handrails on both sides of the stairs. Fix handrails that are broken or loose. Make sure that handrails are as long as the stairways.  Check any carpeting to make sure that it is firmly attached to the stairs. Fix any carpet that is loose or worn.  Avoid having throw rugs at the top or bottom of stairways, or secure the rugs with carpet tape to prevent them from moving.  Make sure that you have a light switch at the top of the stairs and the bottom of the stairs. If you do not have them, have them installed. WHAT ARE SOME OTHER FALL PREVENTION TIPS?  Wear closed-toe shoes that fit well and support your feet. Wear shoes that have rubber  soles or low heels.  When you use a stepladder, make sure that it is completely opened and that the sides are firmly locked. Have someone hold the ladder while you are using it. Do not climb a closed stepladder.  Add color or contrast paint or tape to grab bars and handrails in your home. Place contrasting color strips on the first and last steps.  Use mobility aids as needed, such as canes, walkers, scooters, and crutches.  Turn on lights if it is dark. Replace any light bulbs that burn out.  Set up furniture so that there are clear paths. Keep the furniture in the same spot.  Fix any uneven floor surfaces.  Choose a carpet design that does not hide the edge of steps of a stairway.  Be aware of any and all pets.  Review your medicines  with your healthcare provider. Some medicines can cause dizziness or changes in blood pressure, which increase your risk of falling. Talk with your health care provider about other ways that you can decrease your risk of falls. This may include working with a physical therapist or trainer to improve your strength, balance, and endurance.   This information is not intended to replace advice given to you by your health care provider. Make sure you discuss any questions you have with your health care provider.   Document Released: 09/28/2002 Document Revised: 02/22/2015 Document Reviewed: 11/12/2014 Elsevier Interactive Patient Education [employer]2016 [employer]

## 2016-01-11 NOTE — Telephone Encounter (Signed)
Patient returned Sandy's call, please call [phone].

## 2016-01-11 NOTE — Telephone Encounter (Signed)
I called and spoke to Norman [name] and relayed med instructions and let him know Dr. Anne HahnWillis changed the prescription to show dosage.  Norman [name] verbalized understanding.

## 2016-02-06 ENCOUNTER — Other Ambulatory Visit: Payer: Self-pay | Admitting: Neurology

## 2016-05-10 ENCOUNTER — Other Ambulatory Visit: Payer: Self-pay | Admitting: *Deleted

## 2016-05-10 MED ORDER — DULOXETINE HCL 60 MG PO CPEP: 60.0000 mg | ORAL_CAPSULE | Freq: Two times a day (BID) | ORAL | Status: DC

## 2016-06-15 ENCOUNTER — Other Ambulatory Visit: Payer: Self-pay

## 2016-07-12 ENCOUNTER — Ambulatory Visit (INDEPENDENT_AMBULATORY_CARE_PROVIDER_SITE_OTHER): Payer: Medicare Other | Admitting: Neurology

## 2016-07-12 ENCOUNTER — Encounter: Payer: Self-pay | Admitting: Neurology

## 2016-07-12 VITALS — BP 147/90 | HR 97 | Ht 74.0 in | Wt 274.0 lb

## 2016-07-12 DIAGNOSIS — G6181 Chronic inflammatory demyelinating polyneuritis: Secondary | ICD-10-CM

## 2016-07-12 DIAGNOSIS — R269 Unspecified abnormalities of gait and mobility: Secondary | ICD-10-CM | POA: Diagnosis not present

## 2016-07-12 MED ORDER — PREDNISONE 5 MG PO TABS: 15.0000 mg | ORAL_TABLET | Freq: Every day | ORAL | 3 refills | Status: AC

## 2016-07-12 NOTE — Progress Notes (Signed)
Reason for visit: CIDP  Susa DayDenis J Kimbler is an 65 y.o. male  History of present illness:  Mr. 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 The patient has recently been diagnosed with prediabetes, he remains on prednisone 20 mg daily. The patient has been off of IVIG for 2 months, his neuropathy symptoms have worsened with the numbness going from the knee level up to the mid thigh level. His walking has worsened, he has had worsening balance. The patient does have an increased problem with electric shock sensations in the legs at times. These may occur every 3 or 4 days lasting about 2 hours with episodes occurring every 10-15 minutes. The patient recently has learned his brother had hemachromatosis, he has been screened for this and is negative. The patient is on Cymbalta with some benefit with his neuropathy discomfort. He walks with a cane, he will stumble on occasion. He has a tendency to catch his right foot.  Past Medical History:  Diagnosis Date  . Abnormality of gait 09/28/2013  . 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 HTN (hypertension)   . Hypercholesteremia   . Neuropathy (HCC) 07/24/2013    Past Surgical History:  Procedure Laterality Date  . ABDOMINAL SURGERY     Skin growth removed  . APPENDECTOMY  1973  . CHOLECYSTECTOMY  2007  . FINGER AMPUTATION Right 1979   tips    Family History  Problem Relation Age of Onset  . Cancer Father   . Stroke Mother   . Heart disease Brother   . Cancer Sister     breast cancer  . Heart disease Brother     Social history:  reports that he quit smoking about 7 years ago. His smoking use included Cigarettes. He has never used smokeless tobacco. He reports that he drinks about 4.8 oz of alcohol per week . He reports that he does not use drugs.    Allergies  Allergen Reactions  . Penicillins     childhood  . Prozac [Fluoxetine Hcl] Other  (See Comments)    Made pt jittery    Medications:  Prior to Admission medications   Medication Sig Start Date End Date Taking? Authorizing Provider  amLODipine (NORVASC) 10 MG tablet Take 1 tablet by mouth daily. 07/13/13  Yes Historical Provider, MD  aspirin EC 81 MG tablet Take 81 mg by mouth daily.   Yes Historical Provider, MD  atorvastatin (LIPITOR) 20 MG tablet Take 1 tablet by mouth daily. 07/15/13  Yes Historical Provider, MD  cholecalciferol (VITAMIN D) 1000 UNITS tablet Take 1,000 Units by mouth 2 (two) times daily.   Yes Historical Provider, MD  DULoxetine (CYMBALTA) 60 MG capsule Take 1 capsule (60 mg total) by mouth 2 (two) times daily. 05/10/16  Yes York Spanielharles K Willis, MD  Immune Globulin 5% (OCTAGAM) 5 GM/100ML SOLN Inject 55 g into the vein every 21 ( twenty-one) days. 05/03/14  Yes York Spanielharles K Willis, MD  losartan (COZAAR) 100 MG tablet Take 1 tablet by mouth daily. 07/13/13  Yes Historical Provider, MD  metFORMIN (GLUCOPHAGE) 500 MG tablet Take 500 mg by mouth 2 (two) times daily. 01/03/16  Yes Historical Provider, MD  predniSONE (DELTASONE) 20 MG tablet TAKE 1 TABLET BY MOUTH DAILY 02/06/16  Yes York Spanielharles K Willis, MD  spironolactone (ALDACTONE) 25 MG tablet Take 25 mg by mouth daily.  06/01/14  Yes Historical Provider, MD    ROS:  Out of  a complete 14 system review of symptoms, the patient complains only of the following symptoms, and all other reviewed systems are negative.  Gait disorder Numbness  Blood pressure (!) 147/90, pulse 97, height 6\' 2"  (1.88 m), weight 274 lb (124.3 kg).  Physical Exam  General: The patient is alert and cooperative at the time of the examination. The patient is moderately obese.  Skin: 2+ edema below the knees is noted bilaterally.   Neurologic Exam  Mental status: The patient is alert and oriented x 3 at the time of the examination. The patient has apparent normal recent and remote memory, with an apparently normal attention span and  concentration ability.   Cranial nerves: Facial symmetry is present. Speech is normal, no aphasia or dysarthria is noted. Extraocular movements are full. Visual fields are full.  Motor: The patient has good strength in all 4 extremities.  Sensory examination: Soft touch sensation is symmetric on the face, arms, and legs. The patient reports decreased sensation below the knees bilaterally.  Coordination: The patient has good finger-nose-finger and heel-to-shin bilaterally.  Gait and station: The patient has a slightly wide-based gait. The patient uses a cane for ambulation. Tandem gait was not attempted. Romberg is negative, but is unsteady.  Reflexes: Deep tendon reflexes are symmetric, but are depressed. The knee jerk reflexes are trace positive bilaterally.   Assessment/Plan:  1. Peripheral neuropathy, CIDP  2. Borderline diabetes  3. Gait disorder  The prednisone dose will be reduced to 15 mg daily 2 weeks after the IVIG has been restarted. The patient will follow-up in 6 months, he will contact our office if the clinical condition changes. He will remain on Cymbalta for the neuropathy discomfort for now.  Marlan Palau MD 07/12/2016 7:51 AM  [facility] [address] [place], [place] [address]  Phone [phone] Fax [phone]

## 2016-09-10 ENCOUNTER — Other Ambulatory Visit: Payer: Self-pay | Admitting: Neurology
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
## 2017-01-10 ENCOUNTER — Telehealth: Payer: Self-pay | Admitting: Neurology

## 2017-01-10 ENCOUNTER — Ambulatory Visit: Payer: Medicare Other | Admitting: Neurology

## 2017-01-10 NOTE — Telephone Encounter (Signed)
This patient did not show for a revisit appointment today. 

## 2017-01-17 ENCOUNTER — Encounter: Payer: Self-pay | Admitting: Neurology

## 2017-01-20 DEATH — deceased

## 2017-02-05 ENCOUNTER — Telehealth: Payer: Self-pay | Admitting: Neurology

## 2017-02-05 NOTE — Telephone Encounter (Signed)
Events noted

## 2017-02-05 NOTE — Telephone Encounter (Signed)
Patient's daughter called to advise the patient passed away on 01/19/17.
